# Patient Record
Sex: Female | Born: 1985 | Race: Black or African American | Hispanic: No | Smoking: Former smoker
Health system: Southern US, Community
[De-identification: ages and names within clinical notes are randomized; demographics above are authoritative.]

## PROBLEM LIST (undated history)

## (undated) DIAGNOSIS — N159 Renal tubulo-interstitial disease, unspecified: Secondary | ICD-10-CM

## (undated) DIAGNOSIS — F329 Major depressive disorder, single episode, unspecified: Secondary | ICD-10-CM

## (undated) DIAGNOSIS — F32A Depression, unspecified: Secondary | ICD-10-CM

---
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
## 2011-01-13 ENCOUNTER — Inpatient Hospital Stay (HOSPITAL_COMMUNITY)
Admission: EM | Admit: 2011-01-13 | Discharge: 2011-01-13 | Disposition: A | Discharge status: Home / Self Care | Payer: Self-pay | Source: Ambulatory Visit | Attending: Obstetrics & Gynecology | Admitting: Obstetrics & Gynecology

## 2011-01-13 DIAGNOSIS — R109 Unspecified abdominal pain: Secondary | ICD-10-CM | POA: Insufficient documentation

## 2011-01-13 LAB — URINALYSIS, ROUTINE W REFLEX MICROSCOPIC
Bilirubin Urine: NEGATIVE
Glucose, UA: NEGATIVE mg/dL
Hgb urine dipstick: NEGATIVE
Ketones, ur: NEGATIVE mg/dL
Nitrite: NEGATIVE
Protein, ur: NEGATIVE mg/dL
Specific Gravity, Urine: >1.03 — ABNORMAL HIGH (ref 1.005–1.030)
Urobilinogen, UA: 0.2 mg/dL (ref 0.0–1.0)
pH: 5.5 (ref 5.0–8.0)

## 2011-01-13 LAB — POCT PREGNANCY, URINE: Preg Test, Ur: NEGATIVE

## 2011-01-13 LAB — URINE MICROSCOPIC-ADD ON

## 2011-01-15 ENCOUNTER — Emergency Department (HOSPITAL_COMMUNITY)
Admission: EM | Admit: 2011-01-15 | Discharge: 2011-01-15 | Disposition: A | Discharge status: Home / Self Care | Payer: Self-pay | Attending: Emergency Medicine | Admitting: Emergency Medicine

## 2011-01-15 DIAGNOSIS — K089 Disorder of teeth and supporting structures, unspecified: Secondary | ICD-10-CM | POA: Insufficient documentation

## 2011-02-02 ENCOUNTER — Inpatient Hospital Stay (HOSPITAL_COMMUNITY)
Admission: AD | Admit: 2011-02-02 | Discharge: 2011-02-02 | Disposition: A | Discharge status: Home / Self Care | Payer: Self-pay | Source: Ambulatory Visit | Attending: Family Medicine | Admitting: Family Medicine

## 2011-02-02 DIAGNOSIS — N912 Amenorrhea, unspecified: Secondary | ICD-10-CM | POA: Insufficient documentation

## 2011-02-02 LAB — URINE MICROSCOPIC-ADD ON

## 2011-02-02 LAB — URINALYSIS, ROUTINE W REFLEX MICROSCOPIC
Bilirubin Urine: NEGATIVE
Glucose, UA: NEGATIVE mg/dL
Ketones, ur: NEGATIVE mg/dL
Specific Gravity, Urine: 1.025 (ref 1.005–1.030)
pH: 6 (ref 5.0–8.0)

## 2011-02-02 LAB — POCT PREGNANCY, URINE: Preg Test, Ur: NEGATIVE

## 2011-02-02 LAB — WET PREP, GENITAL: Yeast Wet Prep HPF POC: NONE SEEN

## 2011-02-03 LAB — GC/CHLAMYDIA PROBE AMP, GENITAL: Chlamydia, DNA Probe: NEGATIVE

## 2011-03-03 ENCOUNTER — Encounter: Payer: Self-pay | Admitting: Obstetrics & Gynecology

## 2011-05-09 ENCOUNTER — Emergency Department (HOSPITAL_COMMUNITY)
Admission: EM | Admit: 2011-05-09 | Discharge: 2011-05-09 | Disposition: A | Discharge status: Home / Self Care | Payer: Self-pay | Attending: Emergency Medicine | Admitting: Emergency Medicine

## 2011-05-09 DIAGNOSIS — R599 Enlarged lymph nodes, unspecified: Secondary | ICD-10-CM | POA: Insufficient documentation

## 2011-05-09 DIAGNOSIS — R221 Localized swelling, mass and lump, neck: Secondary | ICD-10-CM | POA: Insufficient documentation

## 2011-05-09 DIAGNOSIS — J029 Acute pharyngitis, unspecified: Secondary | ICD-10-CM | POA: Insufficient documentation

## 2011-05-09 DIAGNOSIS — R220 Localized swelling, mass and lump, head: Secondary | ICD-10-CM | POA: Insufficient documentation

## 2011-05-09 DIAGNOSIS — R509 Fever, unspecified: Secondary | ICD-10-CM | POA: Insufficient documentation

## 2011-05-11 ENCOUNTER — Emergency Department (HOSPITAL_COMMUNITY)
Admission: EM | Admit: 2011-05-11 | Discharge: 2011-05-11 | Disposition: A | Discharge status: Home / Self Care | Payer: Self-pay | Attending: Emergency Medicine | Admitting: Emergency Medicine

## 2011-05-11 DIAGNOSIS — R1310 Dysphagia, unspecified: Secondary | ICD-10-CM | POA: Insufficient documentation

## 2011-05-11 DIAGNOSIS — R509 Fever, unspecified: Secondary | ICD-10-CM | POA: Insufficient documentation

## 2011-05-11 DIAGNOSIS — R070 Pain in throat: Secondary | ICD-10-CM | POA: Insufficient documentation

## 2011-05-11 DIAGNOSIS — J36 Peritonsillar abscess: Secondary | ICD-10-CM | POA: Insufficient documentation

## 2011-05-11 LAB — CBC
Hemoglobin: 13.9 g/dL (ref 12.0–15.0)
MCH: 27 pg (ref 26.0–34.0)
MCV: 81.5 fL (ref 78.0–100.0)
Platelets: 264 10*3/uL (ref 150–400)
RBC: 5.14 MIL/uL — ABNORMAL HIGH (ref 3.87–5.11)
WBC: 15.6 10*3/uL — ABNORMAL HIGH (ref 4.0–10.5)

## 2011-05-11 LAB — DIFFERENTIAL
Basophils Relative: 0 % (ref 0–1)
Eosinophils Absolute: 0.1 10*3/uL (ref 0.0–0.7)
Lymphs Abs: 2.2 10*3/uL (ref 0.7–4.0)
Monocytes Relative: 9 % (ref 3–12)
Neutro Abs: 12 10*3/uL — ABNORMAL HIGH (ref 1.7–7.7)
Neutrophils Relative %: 77 % (ref 43–77)

## 2011-05-11 LAB — BASIC METABOLIC PANEL
CO2: 25 mEq/L (ref 19–32)
Chloride: 103 mEq/L (ref 96–112)
Glucose, Bld: 77 mg/dL (ref 70–99)
Sodium: 138 mEq/L (ref 135–145)

## 2011-05-30 NOTE — Consult Note (Signed)
NAMEBALERIA, Savannah [name] NO.:  [id number]  MEDICAL RECORD NO.:  [id number]  LOCATION:  [facility]                         FACILITY:  [facility]  PHYSICIAN:  Newman Pies, MD            DATE OF BIRTH:  09/17/1985  DATE OF CONSULTATION:  05/11/2011 DATE OF DISCHARGE:                                CONSULTATION   CHIEF COMPLAINT:  Left peritonsillar abscess.  HISTORY OF PRESENT ILLNESS:  The patient is a 25 year old African American female who presents to the [facility] today complaining of severe left-sided sore throat for the past 5 days.  The patient was seen in the emergency room 2 days ago and was given amoxicillin.  However, she complains of progressive worsening of her throat pain even with antibiotics.  Over the last 2 days, she was unable to swallow her saliva.  She has significant dysphagia and odynophagia. The patient also complains of dysphonia and subjective fever.  She has a history of recurrent tonsillitis.  She typically has 1-2 episodes of severe sore throat the year.  She has no previous history of tonsillectomy.  PAST MEDICAL HISTORY:  Moderate obesity, abnormal Pap smear.  PAST SURGICAL HISTORY:  None.  HOME MEDICATIONS:  Amoxicillin.  ALLERGIES:  SUDAFED, which caused localized swelling.  SOCIAL HISTORY:  The patient has a history of cannabis abuse.  She is a current smoker and occasional drinker.  REVIEW OF SYSTEM:  Negative, except as noted above.  PHYSICAL EXAMINATION:  VITALS:  Temperature 99.0, blood pressure 132/80, pulse 108, respirations 16 and oxygen saturation 99% on room air. GENERAL:  The patient is a mildly obese female, in no acute distress. She is alert and oriented x3. HEENT:  Her pupils are equal, round and reactive to light.  Extraocular motion is intact.  Examination of the ears shows normal auricles and external auditory canals.  Nasal examination shows normal septum, turbinates, and nasal mucosa.  Oral cavity  examination shows some mild trismus.  The maximal oral opening is approximately 3-cm.  The lip, gum, tongue, and oral cavity mucosa are within normal limits.  Bilateral tonsillar edema and erythema noted.  The left peritonsillar area is significantly more edematous than the right side, resulting in uvula deviation to the right side.  Palpation of the neck reveals no significant lymphadenopathy or mass.  The trachea is midline.  No stridor is noted.  PROCEDURE PERFORMED:  Incision and drainage of the left peritonsillar abscess.  ANESTHESIA:  Local anesthesia with 1% lidocaine with 1:100,000 epinephrine.  DESCRIPTION:  The patient is placed upright in her hospital bed.  A 1% lidocaine with 1:100,000 epinephrine is locally infiltrated around the left peritonsillar area.  After adequate local anesthesia is achieved, a 20-gauge spinal needle is used to make multiple passes through the peritonsillar area.  Approximately, 0.5 mL of purulent fluid is suctioned.  The superior peritonsillar area is then incised opened with the tip of the spinal needle.  The patient tolerated the procedure well. IMPRESSION:  Bilateral tonsillitis with mild left peritonsillar abscess.  PLAN: 1. IV clindamycin and Decadron in the emergency room as prescribed. 2. Incision and drainage of the left  peritonsillar abscess under local     anesthesia. 3. The patient will be discharged home on clindamycin 300 mg p.o.     q.i.d. for 10 days, and prednisone Dosepak for 6 days.  The patient     will follow up in my office in 1 week if she continues to be     symptomatic.     Newman Pies, MD     ST/MEDQ  D:  05/11/2011  T:  05/11/2011  Job:  [id number]  Electronically Signed by Newman Pies MD on 05/30/2011 05:44:30 PM

## 2011-11-09 ENCOUNTER — Encounter (HOSPITAL_COMMUNITY): Payer: Self-pay | Admitting: *Deleted

## 2011-11-09 ENCOUNTER — Emergency Department (INDEPENDENT_AMBULATORY_CARE_PROVIDER_SITE_OTHER)
Admission: EM | Admit: 2011-11-09 | Discharge: 2011-11-09 | Disposition: A | Discharge status: Home / Self Care | Payer: Self-pay | Source: Home / Self Care | Attending: Emergency Medicine | Admitting: Emergency Medicine

## 2011-11-09 DIAGNOSIS — J020 Streptococcal pharyngitis: Secondary | ICD-10-CM

## 2011-11-09 LAB — POCT INFECTIOUS MONO SCREEN: Mono Screen: NEGATIVE

## 2011-11-09 LAB — POCT RAPID STREP A: Streptococcus, Group A Screen (Direct): NEGATIVE

## 2011-11-09 MED ORDER — ONDANSETRON 8 MG PO TBDP: 8.0000 mg | ORAL_TABLET | Freq: Three times a day (TID) | ORAL | Status: AC | PRN

## 2011-11-09 MED ORDER — PENICILLIN G BENZATHINE 1200000 UNIT/2ML IM SUSP
1.2000 10*6.[IU] | Freq: Once | INTRAMUSCULAR | Status: AC
  Administered 2011-11-09: 1.2 10*6.[IU] via INTRAMUSCULAR

## 2011-11-09 MED ORDER — PENICILLIN G BENZATHINE 1200000 UNIT/2ML IM SUSP
INTRAMUSCULAR | Status: AC
  Filled 2011-11-09: qty 2

## 2011-11-09 NOTE — ED Provider Notes (Signed)
Chief Complaint  Patient presents with  . Sore Throat    History of Present Illness:   Savannah [name] has had a two-day history of severe sore throat, pain with swallowing, nausea, vomiting, fever, chills, nasal congestion, rhinorrhea, swollen, tender glands in her neck, abdominal pain, and loose stools. She denies any cough, wheezing, or shortness of breath. The patient states that she has had tonsillitis or strep throat at least 3 or 4 times every year for as long as she can remember. She has always gone to the emergency room for this. She states she's never been told that she needs to have her tonsils removed.  Review of Systems:  Other than noted above, the patient denies any of the following symptoms. Systemic:  No fever, chills, sweats, fatigue, myalgias, headache, or anorexia. Eye:  No redness, pain or drainage. ENT:  No earache, nasal congestion, rhinorrhea, sinus pressure, or sore throat. Lungs:  No cough, sputum production, wheezing, shortness of breath. Or chest pain. GI:  No nausea, vomiting, abdominal pain or diarrhea. Skin:  No rash or itching.  PMFSH:  Past medical history, family history, social history, meds, and allergies were reviewed.  Physical Exam:   Vital signs:  BP 152/63  Pulse 111  Temp(Src) 101.3 F (38.5 C) (Oral)  Resp 32  SpO2 100%  LMP 11/08/2011 General:  Alert, and appears uncomfortable due to severe sore throat. Eye:  No conjunctival injection or drainage. ENT:  TMs and canals were normal, without erythema or inflammation.  Nasal mucosa was clear and uncongested, without drainage.  Mucous membranes were moist.  Tonsils were enlarged and red and covered with whitish exudate. There was no evidence of a peritonsillar abscess.  There were no oral ulcerations or lesions. Neck:  Supple, no adenopathy, tenderness or mass. Lungs:  No respiratory distress.  Lungs were clear to auscultation, without wheezes, rales or rhonchi.  Breath sounds were clear and equal  bilaterally. Heart:  Regular rhythm, without gallops, murmers or rubs. Skin:  Clear, warm, and dry, without rash or lesions.  Labs:   Results for orders placed during the hospital encounter of 11/09/11  POCT RAPID STREP A ([facility] ONLY)      Component Value Range   Streptococcus, Group A Screen (Direct) NEGATIVE  NEGATIVE   POCT INFECTIOUS MONO SCREEN      Component Value Range   Mono Screen NEGATIVE  NEGATIVE     Course in Urgent Care Center:   She was given Bicillin LA 1.2 million units IM and tolerated this well without any immediate reaction or side effects.  Medical Decision Making:  I think at this point the patient needs to have a tonsillectomy since she seems to be getting strep throat or tonsillitis 3 or 4 times every year.  Assessment:  The encounter diagnosis was Strep throat.  Plan:   1.  The following meds were prescribed:   New Prescriptions   ONDANSETRON (ZOFRAN ODT) 8 MG DISINTEGRATING TABLET    Take 1 tablet (8 mg total) by mouth every 8 (eight) hours as needed for nausea.   2.  The patient was instructed in symptomatic care and handouts were given. 3.  The patient was told to return if becoming worse in any way, if no better in 3 or 4 days, and given some red flag symptoms that would indicate earlier return.  Follow up:  The patient was told to follow up with Dr. Emmit Pomfret for consideration for tonsillectomy.   Reuben Likes,  MD 11/09/11 1805

## 2011-11-09 NOTE — Discharge Instructions (Signed)

## 2011-11-09 NOTE — ED Notes (Signed)
Says tonsils swollen - onset last PM. Feels like she is getting a runny nose - has tonsil problem 3-4 times a year.  Also C/O nausea and vomiting - onset this AM x 7  Says 3 times since she got to [employer]. One loose stool this AM. No cough.

## 2011-11-10 ENCOUNTER — Encounter (HOSPITAL_COMMUNITY): Payer: Self-pay | Admitting: Emergency Medicine

## 2011-11-10 ENCOUNTER — Emergency Department (HOSPITAL_COMMUNITY)
Admission: EM | Admit: 2011-11-10 | Discharge: 2011-11-10 | Disposition: A | Discharge status: Home / Self Care | Payer: Self-pay | Attending: Emergency Medicine | Admitting: Emergency Medicine

## 2011-11-10 DIAGNOSIS — J029 Acute pharyngitis, unspecified: Secondary | ICD-10-CM

## 2011-11-10 DIAGNOSIS — R070 Pain in throat: Secondary | ICD-10-CM | POA: Insufficient documentation

## 2011-11-10 DIAGNOSIS — F17200 Nicotine dependence, unspecified, uncomplicated: Secondary | ICD-10-CM | POA: Insufficient documentation

## 2011-11-10 MED ORDER — ACETAMINOPHEN 325 MG PO TABS
650.0000 mg | ORAL_TABLET | Freq: Once | ORAL | Status: AC
  Administered 2011-11-10: 650 mg via ORAL
  Filled 2011-11-10: qty 2

## 2011-11-10 MED ORDER — DEXAMETHASONE 6 MG PO TABS
6.0000 mg | ORAL_TABLET | ORAL | Status: AC
  Administered 2011-11-10: 6 mg via ORAL
  Filled 2011-11-10: qty 1

## 2011-11-10 MED ORDER — IBUPROFEN 600 MG PO TABS: 600.0000 mg | ORAL_TABLET | Freq: Four times a day (QID) | ORAL | Status: AC | PRN

## 2011-11-10 NOTE — Discharge Instructions (Signed)
Pharyngitis, Viral and Bacterial Pharyngitis is soreness (inflammation) or infection of the pharynx. It is also called a sore throat. CAUSES  Most sore throats are caused by viruses and are part of a cold. However, some sore throats are caused by strep and other bacteria. Sore throats can also be caused by post nasal drip from draining sinuses, allergies and sometimes from sleeping with an open mouth. Infectious sore throats can be spread from person to person by coughing, sneezing and sharing cups or eating utensils. TREATMENT  Sore throats that are viral usually last 3-4 days. Viral illness will get better without medications (antibiotics). Strep throat and other bacterial infections will usually begin to get better about 24-48 hours after you begin to take antibiotics. HOME CARE INSTRUCTIONS   If the caregiver feels there is a bacterial infection or if there is a positive strep test, they will prescribe an antibiotic. The full course of antibiotics must be taken. If the full course of antibiotic is not taken, you or your child may become ill again. If you or your child has strep throat and do not finish all of the medication, serious heart or kidney diseases may develop.   Drink enough water and fluids to keep your urine clear or pale yellow.   Only take over-the-counter or prescription medicines for pain, discomfort or fever as directed by your caregiver.   Get lots of rest.   Gargle with salt water ( tsp. of salt in a glass of water) as often as every 1-2 hours as you need for comfort.   Hard candies may soothe the throat if individual is not at risk for choking. Throat sprays or lozenges may also be used.  SEEK MEDICAL CARE IF:   Large, tender lumps in the neck develop.   A rash develops.   Green, yellow-brown or bloody sputum is coughed up.   Your baby is older than 3 months with a rectal temperature of 100.5 F (38.1 C) or higher for more than 1 day.  SEEK IMMEDIATE MEDICAL CARE  IF:   A stiff neck develops.   You or your child are drooling or unable to swallow liquids.   You or your child are vomiting, unable to keep medications or liquids down.   You or your child has severe pain, unrelieved with recommended medications.   You or your child are having difficulty breathing (not due to stuffy nose).   You or your child are unable to fully open your mouth.   You or your child develop redness, swelling, or severe pain anywhere on the neck.   You have a fever.   Your baby is older than 3 months with a rectal temperature of 102 F (38.9 C) or higher.   Your baby is 3 months old or younger with a rectal temperature of 100.4 F (38 C) or higher.  MAKE SURE YOU:   Understand these instructions.   Will watch your condition.   Will get help right away if you are not doing well or get worse.  Document Released: 07/25/2005 Document Revised: 07/14/2011 Document Reviewed: 10/22/2007 [employer] Patient Information 2012 [employer].Salt Water Gargle This solution will help make your mouth and throat feel better. HOME CARE INSTRUCTIONS   Mix 1 teaspoon of salt in 8 ounces of warm water.   Gargle with this solution as much or often as you need or as directed. Swish and gargle gently if you have any sores or wounds in your mouth.   Do not   swallow this mixture.  Document Released: 04/28/2004 Document Revised: 07/14/2011 Document Reviewed: 09/19/2008 [facility] Patient Information 2012 [place], [place].Sore Throat A sore throat is felt inside the throat and at the back of the mouth. It hurts to swallow or the throat may feel dry and scratchy. It can be caused by germs, smoking, pollution, or allergies.  HOME CARE   Only take medicine as told by your doctor.   Drink enough fluids to keep your pee (urine) clear or pale yellow.   Eat soft foods.   Do not smoke.   Rinse the mouth (gargle) with warm water or salt water ( teaspoon salt in 8 ounces of water).    Try throat sprays, lozenges, or suck on hard candy.  GET HELP RIGHT AWAY IF:   You have trouble breathing.   Your sore throat lasts longer than 1 week.   There is more puffiness (swelling) in the throat.   The pain is so bad that you are unable to swallow.   You have a very bad headache or a red rash.   You start to throw up (vomit).   You or your child has a temperature by mouth above 102 F (38.9 C), not controlled by medicine.   Your baby is older than 3 months with a rectal temperature of 102 F (38.9 C) or higher.   Your baby is 11 months old or younger with a rectal temperature of 100.4 F (38 C) or higher.  MAKE SURE YOU:   Understand these instructions.   Will watch your condition.   Will get help right away if you are not doing well or get worse.  Document Released: 05/03/2008 Document Revised: 07/14/2011 Document Reviewed: 05/03/2008 [facility] Patient Information 2012 [place], [place]. He had been given  Decadron to decrease swelling.  This is a one-time dose.  It is necessary please take regular doses of either Tylenol or ibuprofen for pain and worked fever.  Drink plenty of fluids.  She also again been given a referral to Dr. Valeta Harms,  for followup for valuation of potential tonsillectomy

## 2011-11-10 NOTE — ED Notes (Signed)
The pt was seen at [facility] yesterday and had a neg strep screen

## 2011-11-10 NOTE — ED Notes (Signed)
sorethroat with a temp for 4 days

## 2011-11-10 NOTE — ED Notes (Signed)
11:20 am.  Mother had called reporting prescription was not called in to pharmacy at 4/3 visit.  Spoke with pharmacy: pharmacy needing to talk to patient about expense of medicine.  Called patient's mother at number left for notification Shirlee Limerick huntley [phone]).  No answer.  Left message to contact pharmacy and or ucc for explanation about medicines.

## 2011-11-10 NOTE — ED Notes (Signed)
Pt drinking fluids.

## 2011-11-10 NOTE — ED Notes (Signed)
PT. REPORTS PERSISTENT SORE THROAT FOR 4 DAYS SEEN AT [facility] FOR THE SAME COMPLAINTS PRESCRIPTION GIVEN AT DISCHARGE.

## 2011-11-10 NOTE — ED Notes (Signed)
The pt is drinking without difficulty

## 2011-11-10 NOTE — ED Provider Notes (Signed)
History     CSN: [id number]  Arrival date & time 11/10/11  1840   First MD Initiated Contact with Patient 11/10/11 2012      Chief Complaint  Patient presents with  . Sore Throat    (Consider location/radiation/quality/duration/timing/severity/associated sxs/prior treatment) HPI Comments: She was seen and treated yesterday in urgent care for presumptive strep although the records.  Culture was negative.  She gives a history of having several episodes of agitated tonsillitis yearly since age of 13.  She was also given a prescription for Zofran to control her nausea.  Presents to the emergency room today for pain control, although she has not taken any over-the-counter medications, stating that these don't work for her  She didn't know what to take and she didn't have anything in her home.  She was referred to Dr. Lazarus Salines for further evaluation and potential tonsillectomy.  She has not made an appointment as an exact  Patient is a 26 y.o. female presenting with pharyngitis. The history is provided by the patient.  Sore Throat This is a recurrent problem. Associated symptoms include a fever and a sore throat. Pertinent negatives include no chills, headaches or weakness.    History reviewed. No pertinent past medical history.  History reviewed. No pertinent past surgical history.  No family history on file.  History  Substance Use Topics  . Smoking status: Current Everyday Smoker -- 0.3 packs/day  . Smokeless tobacco: Not on file  . Alcohol Use: Yes     Occasional Drinker    OB History    Grav Para Term Preterm Abortions TAB SAB Ect Mult Living                  Review of Systems  Constitutional: Positive for fever. Negative for chills.  HENT: Positive for sore throat and voice change. Negative for trouble swallowing.   Genitourinary: Negative for dysuria.  Neurological: Negative for dizziness, weakness and headaches.    Allergies  Sudafed  Home Medications   Current  Outpatient Rx  Name Route Sig Dispense Refill  . ONDANSETRON 8 MG PO TBDP Oral Take 1 tablet (8 mg total) by mouth every 8 (eight) hours as needed for nausea. 20 tablet 0    BP 130/68  Pulse 140  Temp(Src) 102.6 F (39.2 C) (Oral)  Resp 18  SpO2 100%  LMP 11/08/2011  Physical Exam  Constitutional: She is oriented to person, place, and time. She appears well-developed and well-nourished.  HENT:  Mouth/Throat: Uvula is midline and mucous membranes are normal. Oropharyngeal exudate present. No posterior oropharyngeal edema, posterior oropharyngeal erythema or tonsillar abscesses.  Neck: Normal range of motion. Neck supple. No thyromegaly present.  Cardiovascular: Regular rhythm.  Tachycardia present.   Pulmonary/Chest: Effort normal.  Abdominal: Soft.  Musculoskeletal: Normal range of motion.  Neurological: She is alert and oriented to person, place, and time.  Skin: Skin is warm.    ED Course  Procedures (including critical care time)  Labs Reviewed - No data to display No results found.   No diagnosis found. 9:36 PM feeling better, tolerating by mouth fluids again, will discharge with recommendations for alternating doses of Tylenol, and or  Motrin for pain control And again, refer to Dr. Levora Angel for followup  MDM  Mr. Decadron for swelling, and Tylenol for pain control and reassess        Arman Filter, NP 11/10/11 2141

## 2011-11-11 NOTE — ED Provider Notes (Signed)
Medical screening examination/treatment/procedure(s) were performed by non-physician practitioner and as supervising physician I was immediately available for consultation/collaboration.   Nithya Meriweather, MD 11/11/11 0036 

## 2011-12-04 ENCOUNTER — Emergency Department (HOSPITAL_COMMUNITY)
Admission: EM | Admit: 2011-12-04 | Discharge: 2011-12-04 | Disposition: A | Discharge status: Home / Self Care | Payer: Self-pay | Attending: Emergency Medicine | Admitting: Emergency Medicine

## 2011-12-04 ENCOUNTER — Encounter (HOSPITAL_COMMUNITY): Payer: Self-pay | Admitting: Emergency Medicine

## 2011-12-04 DIAGNOSIS — F17200 Nicotine dependence, unspecified, uncomplicated: Secondary | ICD-10-CM | POA: Insufficient documentation

## 2011-12-04 DIAGNOSIS — Z113 Encounter for screening for infections with a predominantly sexual mode of transmission: Secondary | ICD-10-CM | POA: Insufficient documentation

## 2011-12-04 DIAGNOSIS — N390 Urinary tract infection, site not specified: Secondary | ICD-10-CM | POA: Insufficient documentation

## 2011-12-04 LAB — URINE MICROSCOPIC-ADD ON

## 2011-12-04 LAB — URINALYSIS, ROUTINE W REFLEX MICROSCOPIC
Nitrite: POSITIVE — AB
Specific Gravity, Urine: 1.022 (ref 1.005–1.030)
Urobilinogen, UA: 1 mg/dL (ref 0.0–1.0)
pH: 6 (ref 5.0–8.0)

## 2011-12-04 LAB — POCT PREGNANCY, URINE: Preg Test, Ur: NEGATIVE

## 2011-12-04 MED ORDER — CEPHALEXIN 500 MG PO CAPS: 500.0000 mg | ORAL_CAPSULE | Freq: Four times a day (QID) | ORAL | Status: AC

## 2011-12-04 NOTE — ED Notes (Addendum)
Patient states she has just found out boyfriend has been diagnosed with herpes and would like to get checked out.  No symptoms at this time.  She would like to get tested for STD's.
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
## 2011-12-04 NOTE — ED Notes (Signed)
Pelvic cart set up in room 
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
## 2011-12-04 NOTE — ED Notes (Signed)
EDP into room 

## 2011-12-04 NOTE — ED Provider Notes (Signed)
  I performed a history and physical examination of Savannah [name] and discussed her management with Dr. Vear Clock.  I agree with the history, physical, assessment, and plan of care, with the following exceptions: None  Exposure to herpes and wants check for STDs. Asymptomatic.  Labs, reassure and dc home with referral to health dept  Dayton Bailiff    Dayton Bailiff, MD 12/04/11 2308

## 2011-12-04 NOTE — ED Notes (Signed)
Discharge instructions reviewed with pt; verbalizes understanding.  No questions asked; no further c/o's voiced.  Pt ambulatory to lobby.

## 2011-12-04 NOTE — Discharge Instructions (Signed)
 RESOURCE GUIDE  Dental Problems  Patients with Medicaid: [facility]                     [facility] [address]                                           [address] Phone:  [phone]                                                  Phone:  [phone]  If unable to pay or uninsured, contact:  [facility] or [facility] to become qualified for the adult dental clinic.  Chronic Pain Problems Contact [facility]  [phone] Patients need to be referred by their primary care doctor.  Insufficient Money for Medicine Contact [employer]:  call "211" or [facility] 271-5999.  No Primary Care Doctor Call [facility]  832-8000 Other agencies that provide inexpensive medical care    [facility]  [phone]    [facility]  [phone]    [facility]  [phone]    [facility]  [phone]    [facility]  [phone]    [facility]  [phone]  Psychological Services [facility]  [phone] [facility]  [phone] [facility]   [phone] (emergency services [phone])  Substance Abuse Resources [facility]  336-882-2125 [facility] 336-784-9470 [facility] 336-285-9073 [facility] 336-845-3988 Residential & Outpatient Substance Abuse Program  800-659-3381  Abuse/Neglect [place] Child Abuse Hotline (336) 641-3795 [place] [facility] Hotline 800-378-5315 (After Hours)  Emergency Shelter [facility] (336) 271-5985  [facility] (336) 275-9566 [facility] (704) 372-4663  MRSA Hotline #:   832-7006    [place] [facility]  [facility] [place]     [employer]                          [facility] [address] [place]                       [address]      [address]  [place]                                                [place]                            [place] Phone:  [phone]                                   Phone:  [phone]                 Phone:  [phone]  [facility] Phone:    [phone]  [place] Child Abuse Hotline (336) 342-1394 (336) 342-3537 (After Hours)   

## 2011-12-05 LAB — GC/CHLAMYDIA PROBE AMP, GENITAL
Chlamydia, DNA Probe: NEGATIVE
GC Probe Amp, Genital: NEGATIVE

## 2012-04-04 ENCOUNTER — Emergency Department (INDEPENDENT_AMBULATORY_CARE_PROVIDER_SITE_OTHER)
Admission: EM | Admit: 2012-04-04 | Discharge: 2012-04-04 | Disposition: A | Discharge status: Home / Self Care | Payer: Self-pay | Source: Home / Self Care | Attending: Family Medicine | Admitting: Family Medicine

## 2012-04-04 ENCOUNTER — Encounter (HOSPITAL_COMMUNITY): Payer: Self-pay | Admitting: *Deleted

## 2012-04-04 DIAGNOSIS — N390 Urinary tract infection, site not specified: Secondary | ICD-10-CM

## 2012-04-04 DIAGNOSIS — J309 Allergic rhinitis, unspecified: Secondary | ICD-10-CM

## 2012-04-04 DIAGNOSIS — J302 Other seasonal allergic rhinitis: Secondary | ICD-10-CM

## 2012-04-04 LAB — POCT URINALYSIS DIP (DEVICE)
Bilirubin Urine: NEGATIVE
Glucose, UA: NEGATIVE mg/dL
Leukocytes, UA: NEGATIVE
Nitrite: POSITIVE — AB
Urobilinogen, UA: 0.2 mg/dL (ref 0.0–1.0)

## 2012-04-04 LAB — POCT PREGNANCY, URINE: Preg Test, Ur: NEGATIVE

## 2012-04-04 MED ORDER — FLUTICASONE PROPIONATE 50 MCG/ACT NA SUSP: 1.0000 | Freq: Two times a day (BID) | NASAL | Status: DC

## 2012-04-04 MED ORDER — FEXOFENADINE HCL 180 MG PO TABS: 180.0000 mg | ORAL_TABLET | Freq: Every day | ORAL | Status: DC

## 2012-04-04 MED ORDER — CEPHALEXIN 500 MG PO CAPS: 500.0000 mg | ORAL_CAPSULE | Freq: Four times a day (QID) | ORAL | Status: AC

## 2012-04-04 NOTE — ED Provider Notes (Signed)
History     CSN: [id number]  Arrival date & time 04/04/12  1516   First MD Initiated Contact with Patient 04/04/12 1524      Chief Complaint  Patient presents with  . URI  . Urinary Tract Infection    (Consider location/radiation/quality/duration/timing/severity/associated sxs/prior treatment) Patient is a 26 y.o. female presenting with URI and urinary tract infection. The history is provided by the patient.  URI The primary symptoms include sore throat. Primary symptoms do not include fever, swollen glands, wheezing, nausea or vomiting. The current episode started 2 days ago. This is a new problem.  Symptoms associated with the illness include congestion and rhinorrhea.  Urinary Tract Infection    History reviewed. No pertinent past medical history.  History reviewed. No pertinent past surgical history.  Family History  Problem Relation Age of Onset  . Family history unknown: Yes    History  Substance Use Topics  . Smoking status: Current Everyday Smoker -- 0.3 packs/day    Types: Cigarettes  . Smokeless tobacco: Not on file  . Alcohol Use: Yes     Occasional Drinker    OB History    Grav Para Term Preterm Abortions TAB SAB Ect Mult Living                  Review of Systems  Constitutional: Negative.  Negative for fever.  HENT: Positive for congestion, sore throat and rhinorrhea.   Respiratory: Negative.  Negative for wheezing.   Gastrointestinal: Negative for nausea and vomiting.    Allergies  Sudafed  Home Medications   Current Outpatient Rx  Name Route Sig Dispense Refill  . CEPHALEXIN 500 MG PO CAPS Oral Take 1 capsule (500 mg total) by mouth 4 (four) times daily. Take all of medicine and drink lots of fluids 20 capsule 0  . FEXOFENADINE HCL 180 MG PO TABS Oral Take 1 tablet (180 mg total) by mouth daily. 30 tablet 1  . FLUTICASONE PROPIONATE 50 MCG/ACT NA SUSP Nasal Place 1 spray into the nose 2 (two) times daily. 1 g 2    BP 136/78  Pulse 87   Temp 97.2 F (36.2 C) (Oral)  Resp 22  SpO2 97%  LMP 03/27/2012  Physical Exam  Nursing note and vitals reviewed. Constitutional: She is oriented to person, place, and time. She appears well-developed and well-nourished.  HENT:  Head: Normocephalic.  Right Ear: External ear normal.  Left Ear: External ear normal.  Nose: Nose normal.  Mouth/Throat: Oropharynx is clear and moist. No oropharyngeal exudate.  Eyes: Conjunctivae are normal. Pupils are equal, round, and reactive to light.  Neck: Normal range of motion. Neck supple.  Cardiovascular: Regular rhythm.   Pulmonary/Chest: Effort normal and breath sounds normal.  Lymphadenopathy:    She has no cervical adenopathy.  Neurological: She is alert and oriented to person, place, and time.  Skin: Skin is warm and dry.    ED Course  Procedures (including critical care time)  Labs Reviewed  POCT URINALYSIS DIP (DEVICE) - Abnormal; Notable for the following:    Hgb urine dipstick TRACE (*)     Nitrite POSITIVE (*)     All other components within normal limits  POCT PREGNANCY, URINE   No results found.   1. Seasonal allergies   2. UTI (lower urinary tract infection)       MDM  U/a neg.       Linna Hoff, MD 04/04/12 [phone]

## 2012-04-04 NOTE — ED Notes (Signed)
Pt reports cold symptoms - throat hurting, coughing, sneezing for the past two days. Also states that she has odor with urination and lower back pain.

## 2012-05-03 ENCOUNTER — Emergency Department (HOSPITAL_COMMUNITY)
Admission: EM | Admit: 2012-05-03 | Discharge: 2012-05-04 | Disposition: A | Discharge status: Home / Self Care | Payer: Self-pay | Attending: Emergency Medicine | Admitting: Emergency Medicine

## 2012-05-03 ENCOUNTER — Encounter (HOSPITAL_COMMUNITY): Payer: Self-pay | Admitting: Emergency Medicine

## 2012-05-03 DIAGNOSIS — IMO0002 Reserved for concepts with insufficient information to code with codable children: Secondary | ICD-10-CM | POA: Insufficient documentation

## 2012-05-03 DIAGNOSIS — F17200 Nicotine dependence, unspecified, uncomplicated: Secondary | ICD-10-CM | POA: Insufficient documentation

## 2012-05-03 DIAGNOSIS — X58XXXA Exposure to other specified factors, initial encounter: Secondary | ICD-10-CM | POA: Insufficient documentation

## 2012-05-03 LAB — URINALYSIS, MICROSCOPIC ONLY
Bilirubin Urine: NEGATIVE
Hgb urine dipstick: NEGATIVE
Ketones, ur: 15 mg/dL — AB
Specific Gravity, Urine: 1.027 (ref 1.005–1.030)
pH: 5.5 (ref 5.0–8.0)

## 2012-05-03 LAB — COMPREHENSIVE METABOLIC PANEL
ALT: 10 U/L (ref 0–35)
AST: 19 U/L (ref 0–37)
Alkaline Phosphatase: 85 U/L (ref 39–117)
CO2: 26 mEq/L (ref 19–32)
Calcium: 9.8 mg/dL (ref 8.4–10.5)
GFR calc non Af Amer: >90 mL/min (ref >90)
Glucose, Bld: 79 mg/dL (ref 70–99)
Potassium: 3.4 mEq/L — ABNORMAL LOW (ref 3.5–5.1)
Sodium: 137 mEq/L (ref 135–145)

## 2012-05-03 LAB — POCT PREGNANCY, URINE: Preg Test, Ur: NEGATIVE

## 2012-05-03 LAB — CBC WITH DIFFERENTIAL/PLATELET
Basophils Absolute: 0 10*3/uL (ref 0.0–0.1)
Eosinophils Relative: 2 % (ref 0–5)
Lymphocytes Relative: 42 % (ref 12–46)
Lymphs Abs: 2.5 10*3/uL (ref 0.7–4.0)
MCV: 81.9 fL (ref 78.0–100.0)
Neutro Abs: 3 10*3/uL (ref 1.7–7.7)
Neutrophils Relative %: 50 % (ref 43–77)
Platelets: 272 10*3/uL (ref 150–400)
RBC: 5.25 MIL/uL — ABNORMAL HIGH (ref 3.87–5.11)
RDW: 15.7 % — ABNORMAL HIGH (ref 11.5–15.5)
WBC: 6 10*3/uL (ref 4.0–10.5)

## 2012-05-03 MED ORDER — KETOROLAC TROMETHAMINE 60 MG/2ML IM SOLN
60.0000 mg | Freq: Once | INTRAMUSCULAR | Status: AC
  Administered 2012-05-03: 60 mg via INTRAMUSCULAR
  Filled 2012-05-03: qty 2

## 2012-05-03 MED ORDER — SULFAMETHOXAZOLE-TRIMETHOPRIM 800-160 MG PO TABS: 1.0000 | ORAL_TABLET | Freq: Two times a day (BID) | ORAL | Status: AC

## 2012-05-03 NOTE — ED Provider Notes (Signed)
History     CSN: [id number]  Arrival date & time 05/03/12  1504   First MD Initiated Contact with Patient 05/03/12 1843      Chief Complaint  Patient presents with  . Abdominal Pain    (Consider location/radiation/quality/duration/timing/severity/associated sxs/prior treatment) Patient is a 26 y.o. female presenting with abdominal pain.  Abdominal Pain The primary symptoms of the illness include abdominal pain (L flank pain) and dysuria. The primary symptoms of the illness do not include fever, fatigue, shortness of breath, nausea, vomiting, diarrhea, hematemesis, hematochezia, vaginal discharge or vaginal bleeding. The current episode started more than 2 days ago. The onset of the illness was gradual. The problem has been gradually worsening.  The dysuria is associated with frequency. The dysuria is not associated with hematuria or urgency.  Associated with: none. The patient states that she believes she is currently not pregnant. Additional symptoms associated with the illness include frequency and back pain. Symptoms associated with the illness do not include chills, anorexia, diaphoresis, heartburn, constipation, urgency or hematuria. Significant associated medical issues do not include PUD, GERD, inflammatory bowel disease, diabetes, sickle cell disease, gallstones, liver disease, substance abuse, diverticulitis, HIV or cardiac disease.    History reviewed. No pertinent past medical history.  History reviewed. No pertinent past surgical history.  History reviewed. No pertinent family history.  History  Substance Use Topics  . Smoking status: Current Every Day Smoker -- 0.3 packs/day    Types: Cigarettes  . Smokeless tobacco: Not on file  . Alcohol Use: Yes     Occasional Drinker    OB History    Grav Para Term Preterm Abortions TAB SAB Ect Mult Living                  Review of Systems  Constitutional: Negative for fever, chills, diaphoresis, activity change, appetite  change and fatigue.  HENT: Negative for ear pain, congestion, rhinorrhea and neck pain.   Eyes: Negative for pain.  Respiratory: Negative for cough and shortness of breath.   Cardiovascular: Negative for chest pain and palpitations.  Gastrointestinal: Positive for abdominal pain (L flank pain). Negative for heartburn, nausea, vomiting, diarrhea, constipation, hematochezia, anorexia and hematemesis.  Genitourinary: Positive for dysuria and frequency. Negative for urgency, hematuria, vaginal bleeding, vaginal discharge, difficulty urinating and pelvic pain.  Musculoskeletal: Positive for back pain.  Skin: Negative for rash and wound.  Neurological: Negative for weakness and headaches.  Psychiatric/Behavioral: Negative for behavioral problems, confusion and agitation.    Allergies  Sudafed  Home Medications  No current outpatient prescriptions on file.  BP 118/72  Pulse 78  Temp 97.8 F (36.6 C) (Oral)  Resp 15  SpO2 100%  LMP 03/19/2012  Physical Exam  Constitutional: She is oriented to person, place, and time. She appears well-developed and well-nourished. No distress.  HENT:  Head: Normocephalic and atraumatic.  Nose: Nose normal.  Mouth/Throat: Oropharynx is clear and moist.  Eyes: EOM are normal. Pupils are equal, round, and reactive to light.  Neck: Normal range of motion. Neck supple. No tracheal deviation present.  Cardiovascular: Normal rate, regular rhythm, normal heart sounds and intact distal pulses.   Pulmonary/Chest: Effort normal and breath sounds normal. She has no rales.  Abdominal: Soft. Bowel sounds are normal. She exhibits no distension. There is no tenderness. There is no rebound and no guarding.  Musculoskeletal: Normal range of motion. She exhibits no tenderness.  Neurological: She is alert and oriented to person, place, and time.  Skin: Skin is  warm and dry. No rash noted.  Psychiatric: She has a normal mood and affect. Her behavior is normal.    ED  Course  Procedures (including critical care time)    Results for orders placed during the hospital encounter of 05/03/12  CBC WITH DIFFERENTIAL      Component Value Range   WBC 6.0  4.0 - 10.5 K/uL   RBC 5.25 (*) 3.87 - 5.11 MIL/uL   Hemoglobin 14.2  12.0 - 15.0 g/dL   HCT 16.1  09.6 - 04.5 %   MCV 81.9  78.0 - 100.0 fL   MCH 27.0  26.0 - 34.0 pg   MCHC 33.0  30.0 - 36.0 g/dL   RDW 40.9 (*) 81.1 - 91.4 %   Platelets 272  150 - 400 K/uL   Neutrophils Relative 50  43 - 77 %   Neutro Abs 3.0  1.7 - 7.7 K/uL   Lymphocytes Relative 42  12 - 46 %   Lymphs Abs 2.5  0.7 - 4.0 K/uL   Monocytes Relative 6  3 - 12 %   Monocytes Absolute 0.4  0.1 - 1.0 K/uL   Eosinophils Relative 2  0 - 5 %   Eosinophils Absolute 0.1  0.0 - 0.7 K/uL   Basophils Relative 0  0 - 1 %   Basophils Absolute 0.0  0.0 - 0.1 K/uL  COMPREHENSIVE METABOLIC PANEL      Component Value Range   Sodium 137  135 - 145 mEq/L   Potassium 3.4 (*) 3.5 - 5.1 mEq/L   Chloride 101  96 - 112 mEq/L   CO2 26  19 - 32 mEq/L   Glucose, Bld 79  70 - 99 mg/dL   BUN 8  6 - 23 mg/dL   Creatinine, Ser 7.82  0.50 - 1.10 mg/dL   Calcium 9.8  8.4 - 95.6 mg/dL   Total Protein 8.2  6.0 - 8.3 g/dL   Albumin 4.4  3.5 - 5.2 g/dL   AST 19  0 - 37 U/L   ALT 10  0 - 35 U/L   Alkaline Phosphatase 85  39 - 117 U/L   Total Bilirubin 0.6  0.3 - 1.2 mg/dL   GFR calc non Af Amer >90  >90 mL/min   GFR calc Af Amer >90  >90 mL/min  URINALYSIS, MICROSCOPIC ONLY      Component Value Range   Color, Urine YELLOW  YELLOW   APPearance CLOUDY (*) CLEAR   Specific Gravity, Urine 1.027  1.005 - 1.030   pH 5.5  5.0 - 8.0   Glucose, UA NEGATIVE  NEGATIVE mg/dL   Hgb urine dipstick NEGATIVE  NEGATIVE   Bilirubin Urine NEGATIVE  NEGATIVE   Ketones, ur 15 (*) NEGATIVE mg/dL   Protein, ur NEGATIVE  NEGATIVE mg/dL   Urobilinogen, UA 1.0  0.0 - 1.0 mg/dL   Nitrite NEGATIVE  NEGATIVE   Leukocytes, UA TRACE (*) NEGATIVE   WBC, UA 0-2  <3 WBC/hpf   RBC / HPF  0-2  <3 RBC/hpf   Bacteria, UA MANY (*) RARE   Squamous Epithelial / LPF MANY (*) RARE   Urine-Other MUCOUS PRESENT    POCT PREGNANCY, URINE      Component Value Range   Preg Test, Ur NEGATIVE  NEGATIVE      1. Back sprain or strain       MDM    New Prescriptions   SULFAMETHOXAZOLE-TRIMETHOPRIM (SEPTRA DS) 800-160 MG PER TABLET  Take 1 tablet by mouth 2 (two) times daily.     26 yo F in in no acute distress, afebrile, vital signs stable, non toxic appearing who presents with  Couple days hx of dysuria and frequency. No fevers, nausea or vomiting.  UA with possible UTI. Doubt renal stone. Abdominal exam with no TTP, guarding or rebound. No pelvic pain. Thorough discussion with patient including return precautions. Patient expressed understanding.        Nadara Mustard, MD 05/03/12 2328

## 2012-05-03 NOTE — ED Notes (Addendum)
Pt reports for about a month that she has been having L lower abd pain that is now radiating into L side; pt reports hurts with deep breath, also states that urine smells--denies dysuria, abnormal discharge,n/v/d, fevers; when pulled pt to triage room--pt was eating meal, and spoke with pt that she needs to be NPO, pt upset, but stopped eating

## 2012-05-03 NOTE — ED Provider Notes (Addendum)
26 year old female had onset today of severe, generalized abdominal pain which resolved but now has moved into the left flank area. There is no associated nausea, vomiting, diaphoresis. There is no urinary urgency, frequency, and tenesmus. She states her urine does smell funny and she is worried she might have a urinary tract infection. On exam, lungs are clear and heart has regular rate and rhythm. Abdomen is soft and nontender. There is a tenderness to palpation throughout the left paralumbar area and costovertebral angle area without definite CVA tenderness. Urinalysis is unremarkable, but it is a contaminated specimen. She'll be given a therapeutic trial of ketorolac and is reassured that there is no evidence of serious pathology at this point.  Dione Booze, MD 05/03/12 2130  I saw and evaluated the patient, reviewed the resident's note and I agree with the findings and plan.  Dione Booze, MD 05/04/12 [phone]

## 2012-05-09 ENCOUNTER — Encounter (HOSPITAL_COMMUNITY): Payer: Self-pay | Admitting: *Deleted

## 2012-05-09 ENCOUNTER — Emergency Department (HOSPITAL_COMMUNITY)
Admission: EM | Admit: 2012-05-09 | Discharge: 2012-05-10 | Disposition: A | Discharge status: Home / Self Care | Payer: Self-pay | Attending: Emergency Medicine | Admitting: Emergency Medicine

## 2012-05-09 ENCOUNTER — Emergency Department (HOSPITAL_COMMUNITY): Payer: Self-pay

## 2012-05-09 DIAGNOSIS — M940 Chondrocostal junction syndrome [Tietze]: Secondary | ICD-10-CM | POA: Insufficient documentation

## 2012-05-09 DIAGNOSIS — R079 Chest pain, unspecified: Secondary | ICD-10-CM | POA: Insufficient documentation

## 2012-05-09 DIAGNOSIS — R0609 Other forms of dyspnea: Secondary | ICD-10-CM | POA: Insufficient documentation

## 2012-05-09 DIAGNOSIS — R0989 Other specified symptoms and signs involving the circulatory and respiratory systems: Secondary | ICD-10-CM | POA: Insufficient documentation

## 2012-05-09 DIAGNOSIS — F17200 Nicotine dependence, unspecified, uncomplicated: Secondary | ICD-10-CM | POA: Insufficient documentation

## 2012-05-09 DIAGNOSIS — R109 Unspecified abdominal pain: Secondary | ICD-10-CM | POA: Insufficient documentation

## 2012-05-09 LAB — CBC WITH DIFFERENTIAL/PLATELET
Basophils Absolute: 0 10*3/uL (ref 0.0–0.1)
Basophils Relative: 0 % (ref 0–1)
Eosinophils Absolute: 0.1 10*3/uL (ref 0.0–0.7)
Eosinophils Relative: 1 % (ref 0–5)
Lymphs Abs: 1.9 10*3/uL (ref 0.7–4.0)
MCH: 26.1 pg (ref 26.0–34.0)
MCHC: 32.1 g/dL (ref 30.0–36.0)
MCV: 81.3 fL (ref 78.0–100.0)
Platelets: 276 10*3/uL (ref 150–400)
RDW: 16.1 % — ABNORMAL HIGH (ref 11.5–15.5)

## 2012-05-09 LAB — URINALYSIS, ROUTINE W REFLEX MICROSCOPIC
Bilirubin Urine: NEGATIVE
Ketones, ur: NEGATIVE mg/dL
Nitrite: NEGATIVE
Protein, ur: NEGATIVE mg/dL
Urobilinogen, UA: 0.2 mg/dL (ref 0.0–1.0)

## 2012-05-09 LAB — BASIC METABOLIC PANEL
Calcium: 9.5 mg/dL (ref 8.4–10.5)
GFR calc non Af Amer: >90 mL/min (ref >90)
Glucose, Bld: 93 mg/dL (ref 70–99)
Sodium: 138 mEq/L (ref 135–145)

## 2012-05-09 MED ORDER — IBUPROFEN 800 MG PO TABS: 800.0000 mg | ORAL_TABLET | Freq: Three times a day (TID) | ORAL | Status: AC

## 2012-05-09 MED ORDER — IBUPROFEN 800 MG PO TABS
800.0000 mg | ORAL_TABLET | Freq: Once | ORAL | Status: AC
  Administered 2012-05-09: 800 mg via ORAL
  Filled 2012-05-09: qty 1

## 2012-05-09 NOTE — ED Notes (Signed)
Pt given ginger ale per Dr.Yoder

## 2012-05-09 NOTE — ED Notes (Signed)
Pt is here with bilateral flank pain and states pain with deep breath.  No urinary symptoms.  No vaginal discharge or breathing

## 2012-05-09 NOTE — ED Notes (Signed)
EMD at bedside.
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
## 2012-05-09 NOTE — ED Notes (Signed)
PT reports constipation

## 2012-05-10 NOTE — ED Provider Notes (Signed)
I saw and evaluated the patient, reviewed the resident's note and I agree with the findings and plan.   Loren Racer, MD 05/10/12 1531

## 2013-05-23 ENCOUNTER — Encounter (HOSPITAL_COMMUNITY): Payer: Self-pay | Admitting: Emergency Medicine

## 2013-05-23 ENCOUNTER — Emergency Department (INDEPENDENT_AMBULATORY_CARE_PROVIDER_SITE_OTHER)
Admission: EM | Admit: 2013-05-23 | Discharge: 2013-05-23 | Disposition: A | Discharge status: Home / Self Care | Payer: Self-pay | Source: Home / Self Care | Attending: Emergency Medicine | Admitting: Emergency Medicine

## 2013-05-23 DIAGNOSIS — J029 Acute pharyngitis, unspecified: Secondary | ICD-10-CM

## 2013-05-23 DIAGNOSIS — H6590 Unspecified nonsuppurative otitis media, unspecified ear: Secondary | ICD-10-CM

## 2013-05-23 LAB — POCT URINALYSIS DIP (DEVICE)
Bilirubin Urine: NEGATIVE
Leukocytes, UA: NEGATIVE
Nitrite: POSITIVE — AB
pH: 6.5 (ref 5.0–8.0)

## 2013-05-23 LAB — POCT PREGNANCY, URINE: Preg Test, Ur: NEGATIVE

## 2013-05-23 MED ORDER — METHYLPREDNISOLONE 4 MG PO KIT: PACK | ORAL | Status: DC

## 2013-05-23 MED ORDER — FLUTICASONE PROPIONATE 50 MCG/ACT NA SUSP: 2.0000 | Freq: Every day | NASAL | Status: DC

## 2013-05-23 NOTE — ED Provider Notes (Signed)
Medical screening examination/treatment/procedure(s) were performed by non-physician practitioner and as supervising physician I was immediately available for consultation/collaboration.  Leslee Home, M.D.  Reuben Likes, MD 05/23/13 [phone]

## 2013-05-23 NOTE — ED Notes (Signed)
Pt c/o sore throat onset 4 days Denies: f/v/n/d, cold sxs, SOB, wheezing Also c/o foul urine odor... Denies: dysuria She is alert w/no signs of acute distress.

## 2013-05-23 NOTE — ED Provider Notes (Signed)
CSN: [id number]     Arrival date & time 05/23/13  4098 History   First MD Initiated Contact with Patient 05/23/13 1019     Chief Complaint  Patient presents with  . Sore Throat   (Consider location/radiation/quality/duration/timing/severity/associated sxs/prior Treatment) HPI Comments: 27 year old female presents complaining of sore throat since 4 days ago. She also admits to some pressure in her left ear and some soreness in the left side of her neck. These symptoms have been constant since they began. She is not taking any medicine to try to help them. There are no exacerbating or alleviating factors. She denies fever, chills, sick contacts, recent travel, or any other systemic symptoms.  Additionally, she wants to make sure she does not have a urinary tract infection. For 2 months, she has had dark and foul-smelling urine. She believes this may be because she only drinks soda and never drinks any water. Denies any abdominal pain, hematuria, dysuria, flank pain. No vaginal discharge or risk factors for STDs.  Patient is a 27 y.o. female presenting with pharyngitis.  Sore Throat Pertinent negatives include no chest pain, no abdominal pain and no shortness of breath.    History reviewed. No pertinent past medical history. History reviewed. No pertinent past surgical history. No family history on file. History  Substance Use Topics  . Smoking status: Current Every Day Smoker -- 0.30 packs/day    Types: Cigarettes  . Smokeless tobacco: Not on file  . Alcohol Use: Yes     Comment: Occasional Drinker   OB History   Grav Para Term Preterm Abortions TAB SAB Ect Mult Living                 Review of Systems  Constitutional: Negative for fever and chills.  HENT: Positive for ear pain (pressure) and sore throat. Negative for postnasal drip, rhinorrhea and sinus pressure.   Eyes: Negative for visual disturbance.  Respiratory: Negative for cough and shortness of breath.   Cardiovascular:  Negative for chest pain, palpitations and leg swelling.  Gastrointestinal: Negative for nausea, vomiting and abdominal pain.  Endocrine: Negative for polydipsia and polyuria.  Genitourinary: Negative for dysuria, urgency, frequency, flank pain, vaginal discharge and pelvic pain.  Musculoskeletal: Positive for neck pain. Negative for arthralgias and myalgias.  Skin: Negative for rash.  Neurological: Negative for dizziness, weakness and light-headedness.    Allergies  Sudafed  Home Medications   Current Outpatient Rx  Name  Route  Sig  Dispense  Refill  . fluticasone (FLONASE) 50 MCG/ACT nasal spray   Nasal   Place 2 sprays into the nose daily.   1 g   2   . methylPREDNISolone (MEDROL DOSEPAK) 4 MG tablet      follow package directions   21 tablet   0     Dispense as written.    BP 137/74  Pulse 79  Temp(Src) 98.2 F (36.8 C) (Oral)  Resp 18  SpO2 100%  LMP 05/16/2013 Physical Exam  Nursing note and vitals reviewed. Constitutional: She is oriented to person, place, and time. Vital signs are normal. She appears well-developed and well-nourished. No distress.  HENT:  Head: Normocephalic and atraumatic.  Right Ear: Hearing, tympanic membrane, external ear and ear canal normal.  Left Ear: Hearing, external ear and ear canal normal. A middle ear effusion (serous) is present.  Nose: Nose normal. Right sinus exhibits no maxillary sinus tenderness and no frontal sinus tenderness. Left sinus exhibits no maxillary sinus tenderness and no frontal sinus  tenderness.  Mouth/Throat: Uvula is midline, oropharynx is clear and moist and mucous membranes are normal.  Pulmonary/Chest: Effort normal. No respiratory distress.  Lymphadenopathy:       Head (right side): No tonsillar adenopathy present.       Head (left side): Tonsillar (tender) adenopathy present.  Neurological: She is alert and oriented to person, place, and time. She has normal strength. Coordination normal.  Skin: Skin  is warm and dry. No rash noted. She is not diaphoretic.  Psychiatric: She has a normal mood and affect. Judgment normal.    ED Course  Procedures (including critical care time) Labs Review Labs Reviewed  POCT URINALYSIS DIP (DEVICE) - Abnormal; Notable for the following:    Hgb urine dipstick TRACE (*)    Nitrite POSITIVE (*)    All other components within normal limits  URINE CULTURE  POCT RAPID STREP A ([facility] ONLY)  POCT PREGNANCY, URINE   Imaging Review No results found.    MDM   1. Pharyngitis   2. Serous otitis media, left    Patient is afebrile. She does have a single inflamed lymph node that is most likely causing serous otitis media. Treating with steroids, NSAIDs, Flonase, antihistamine. Followup if not improving   Meds ordered this encounter  Medications  . methylPREDNISolone (MEDROL DOSEPAK) 4 MG tablet    Sig: follow package directions    Dispense:  21 tablet    Refill:  0    Order Specific Question:  Supervising Provider    Answer:  Lorenz Coaster, DAVID C [id number]  . fluticasone (FLONASE) 50 MCG/ACT nasal spray    Sig: Place 2 sprays into the nose daily.    Dispense:  1 g    Refill:  2    Order Specific Question:  Supervising Provider    Answer:  Lorenz Coaster, DAVID C [[id number]       Graylon Good, PA-C 05/23/13 1046

## 2013-05-26 LAB — CULTURE, GROUP A STREP

## 2013-05-27 ENCOUNTER — Telehealth (HOSPITAL_COMMUNITY): Payer: Self-pay | Admitting: *Deleted

## 2013-05-27 MED ORDER — PENICILLIN V POTASSIUM 500 MG PO TABS: 500.0000 mg | ORAL_TABLET | Freq: Four times a day (QID) | ORAL | Status: DC

## 2013-05-27 NOTE — ED Notes (Signed)
Throat culture: Strep beta hemolytic not group A.  Lab shown to Langston Masker PA and she e-prescribed PCN to the [facility] at [facility]. I called pt. and left a message to call. Vassie Moselle 05/27/2013

## 2013-05-28 NOTE — ED Notes (Signed)
I called pt. Pt. verified x 2 and given result.  Pt. told she has a Rx. of Pen V K at the [facility] at [facility]. Pt. told to tell anyone she has exposed to get checked if the get the same symptoms.  Pt. asked about her other test. I told her the urine culture said " needs to be collected."  I told her appeared that her urine sample was not sent to the lab for culture.  I asked about her symptoms.  She said she did not have any but her urine is dark with an odor. Pt. instructed to drink lots of water to decrease the concentration of her urine.  I reviewed symptoms of UTI and told her to come back if she gets these symptoms, so we can obtain another urine sample.  Pt. voiced understanding. Vassie Moselle 05/28/2013

## 2013-09-27 ENCOUNTER — Encounter (HOSPITAL_COMMUNITY): Payer: Self-pay | Admitting: Emergency Medicine

## 2013-09-27 ENCOUNTER — Encounter (HOSPITAL_COMMUNITY): Payer: Self-pay

## 2013-09-27 ENCOUNTER — Emergency Department (HOSPITAL_COMMUNITY)
Admission: EM | Admit: 2013-09-27 | Discharge: 2013-09-27 | Disposition: A | Discharge status: Other Acute Inpatient Hospital | Payer: Self-pay | Attending: Emergency Medicine | Admitting: Emergency Medicine

## 2013-09-27 ENCOUNTER — Inpatient Hospital Stay (HOSPITAL_COMMUNITY)
Admission: AD | Admit: 2013-09-27 | Discharge: 2013-10-02 | DRG: 885 | Disposition: A | Discharge status: Home / Self Care | Payer: Federal, State, Local not specified - Other | Attending: Psychiatry | Admitting: Psychiatry

## 2013-09-27 DIAGNOSIS — R45850 Homicidal ideations: Secondary | ICD-10-CM | POA: Insufficient documentation

## 2013-09-27 DIAGNOSIS — F32A Depression, unspecified: Secondary | ICD-10-CM

## 2013-09-27 DIAGNOSIS — F1210 Cannabis abuse, uncomplicated: Secondary | ICD-10-CM | POA: Insufficient documentation

## 2013-09-27 DIAGNOSIS — R45851 Suicidal ideations: Secondary | ICD-10-CM | POA: Insufficient documentation

## 2013-09-27 DIAGNOSIS — F17200 Nicotine dependence, unspecified, uncomplicated: Secondary | ICD-10-CM | POA: Insufficient documentation

## 2013-09-27 DIAGNOSIS — F29 Unspecified psychosis not due to a substance or known physiological condition: Secondary | ICD-10-CM

## 2013-09-27 DIAGNOSIS — K921 Melena: Secondary | ICD-10-CM | POA: Insufficient documentation

## 2013-09-27 DIAGNOSIS — F332 Major depressive disorder, recurrent severe without psychotic features: Principal | ICD-10-CM | POA: Diagnosis present

## 2013-09-27 DIAGNOSIS — G479 Sleep disorder, unspecified: Secondary | ICD-10-CM | POA: Insufficient documentation

## 2013-09-27 DIAGNOSIS — F1010 Alcohol abuse, uncomplicated: Secondary | ICD-10-CM | POA: Insufficient documentation

## 2013-09-27 DIAGNOSIS — F39 Unspecified mood [affective] disorder: Secondary | ICD-10-CM

## 2013-09-27 DIAGNOSIS — F329 Major depressive disorder, single episode, unspecified: Secondary | ICD-10-CM | POA: Insufficient documentation

## 2013-09-27 DIAGNOSIS — F3289 Other specified depressive episodes: Secondary | ICD-10-CM | POA: Insufficient documentation

## 2013-09-27 HISTORY — DX: Major depressive disorder, single episode, unspecified: F32.9

## 2013-09-27 HISTORY — DX: Depression, unspecified: F32.A

## 2013-09-27 LAB — ETHANOL: Alcohol, Ethyl (B): <11 mg/dL (ref 0–11)

## 2013-09-27 LAB — COMPREHENSIVE METABOLIC PANEL
ALT: 9 U/L (ref 0–35)
AST: 16 U/L (ref 0–37)
Albumin: 4.1 g/dL (ref 3.5–5.2)
Alkaline Phosphatase: 68 U/L (ref 39–117)
BUN: 7 mg/dL (ref 6–23)
CO2: 24 meq/L (ref 19–32)
Calcium: 9.2 mg/dL (ref 8.4–10.5)
Chloride: 100 mEq/L (ref 96–112)
Creatinine, Ser: 0.77 mg/dL (ref 0.50–1.10)
GFR calc Af Amer: >90 mL/min (ref >90)
GLUCOSE: 84 mg/dL (ref 70–99)
Potassium: 3.7 mEq/L (ref 3.7–5.3)
SODIUM: 138 meq/L (ref 137–147)
Total Bilirubin: 0.7 mg/dL (ref 0.3–1.2)
Total Protein: 7.5 g/dL (ref 6.0–8.3)

## 2013-09-27 LAB — CBC
HCT: 41.7 % (ref 36.0–46.0)
HEMOGLOBIN: 13.6 g/dL (ref 12.0–15.0)
MCH: 27.1 pg (ref 26.0–34.0)
MCHC: 32.6 g/dL (ref 30.0–36.0)
MCV: 83.2 fL (ref 78.0–100.0)
Platelets: 252 10*3/uL (ref 150–400)
RBC: 5.01 MIL/uL (ref 3.87–5.11)
RDW: 15.8 % — ABNORMAL HIGH (ref 11.5–15.5)
WBC: 6.8 10*3/uL (ref 4.0–10.5)

## 2013-09-27 LAB — RAPID URINE DRUG SCREEN, HOSP PERFORMED
Amphetamines: NOT DETECTED
BARBITURATES: NOT DETECTED
Benzodiazepines: NOT DETECTED
Cocaine: NOT DETECTED
Opiates: NOT DETECTED
Tetrahydrocannabinol: POSITIVE — AB

## 2013-09-27 LAB — POC OCCULT BLOOD, ED: FECAL OCCULT BLD: POSITIVE — AB

## 2013-09-27 MED ORDER — ONDANSETRON HCL 4 MG PO TABS: 4.0000 mg | ORAL_TABLET | Freq: Three times a day (TID) | ORAL | Status: DC | PRN

## 2013-09-27 MED ORDER — ZOLPIDEM TARTRATE 5 MG PO TABS: 5.0000 mg | ORAL_TABLET | Freq: Every evening | ORAL | Status: DC | PRN

## 2013-09-27 MED ORDER — IBUPROFEN 200 MG PO TABS: 600.0000 mg | ORAL_TABLET | Freq: Three times a day (TID) | ORAL | Status: DC | PRN

## 2013-09-27 MED ORDER — ARIPIPRAZOLE 10 MG PO TABS
10.0000 mg | ORAL_TABLET | Freq: Every day | ORAL | Status: DC
  Administered 2013-09-27: 10 mg via ORAL
  Filled 2013-09-27: qty 1

## 2013-09-27 MED ORDER — ALUM & MAG HYDROXIDE-SIMETH 200-200-20 MG/5ML PO SUSP: 30.0000 mL | ORAL | Status: DC | PRN

## 2013-09-27 MED ORDER — NICOTINE 21 MG/24HR TD PT24
21.0000 mg | MEDICATED_PATCH | Freq: Every day | TRANSDERMAL | Status: DC
  Filled 2013-09-27 (×8): qty 1

## 2013-09-27 MED ORDER — MAGNESIUM HYDROXIDE 400 MG/5ML PO SUSP: 30.0000 mL | Freq: Every day | ORAL | Status: DC | PRN

## 2013-09-27 MED ORDER — ACETAMINOPHEN 325 MG PO TABS
650.0000 mg | ORAL_TABLET | ORAL | Status: DC | PRN
Start: 2013-09-27 — End: 2013-10-02

## 2013-09-27 MED ORDER — ZOLPIDEM TARTRATE 5 MG PO TABS
5.0000 mg | ORAL_TABLET | Freq: Every evening | ORAL | Status: DC | PRN
  Administered 2013-09-27 – 2013-10-01 (×5): 5 mg via ORAL
  Filled 2013-09-27 (×5): qty 1

## 2013-09-27 MED ORDER — ARIPIPRAZOLE 10 MG PO TABS
10.0000 mg | ORAL_TABLET | Freq: Every day | ORAL | Status: DC
  Administered 2013-09-28 – 2013-10-02 (×5): 10 mg via ORAL
  Filled 2013-09-27 (×8): qty 1

## 2013-09-27 MED ORDER — LORAZEPAM 1 MG PO TABS: 1.0000 mg | ORAL_TABLET | Freq: Three times a day (TID) | ORAL | Status: DC | PRN

## 2013-09-27 MED ORDER — NICOTINE 21 MG/24HR TD PT24: 21.0000 mg | MEDICATED_PATCH | Freq: Every day | TRANSDERMAL | Status: DC

## 2013-09-27 MED ORDER — IBUPROFEN 600 MG PO TABS: 600.0000 mg | ORAL_TABLET | Freq: Three times a day (TID) | ORAL | Status: DC | PRN

## 2013-09-27 MED ORDER — ACETAMINOPHEN 325 MG PO TABS: 650.0000 mg | ORAL_TABLET | ORAL | Status: DC | PRN

## 2013-09-27 NOTE — Progress Notes (Signed)
[facility] did not get to see patient but will be sending information about the [facility] Orange Card program, using the address provided.  °

## 2013-09-27 NOTE — ED Notes (Signed)
[facility] transported pt to [facility] 

## 2013-09-27 NOTE — Progress Notes (Signed)
[facility] Group Notes:  (Nursing/MHT/Case Management/Adjunct)  Date:  09/27/2013  Time:  11:32 PM  Type of Therapy:  Group Therapy  Participation Level:  Active  Participation Quality:  Appropriate and Sharing  Affect:  Appropriate  Cognitive:  Oriented  Insight:  Improving  Engagement in Group:  Developing/Improving  Modes of Intervention:  Orientation, Socialization and Support  Summary of Progress/Problems: Pt. Was recently admitted to the facility.  Pt. Stated she wanted to learn coping skills for her anxiety.  Sondra ComeWilson, Starlene Consuegra J 09/27/2013, 11:32 PM
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
## 2013-09-27 NOTE — Progress Notes (Signed)
Recreation Therapy Notes  Date: 02.20.2015 Time: 2:45pm Location: [address] Hall Dayroom   Group Topic: Communication, Team Building, Problem Solving  Goal Area(s) Addresses:  Patient will effectively work with peer towards shared goal.  Patient will identify skill used to make activity successful.  Patient will identify how skills used during activity can be used to reach post d/c goals.   Behavioral Response: Did not attend.   Tim Wilhide L Rae Sutcliffe, LRT/CTRS  Sharetta Ricchio L 09/27/2013 4:21 PM 

## 2013-09-27 NOTE — ED Notes (Addendum)
MD notified of +hemoccult

## 2013-09-27 NOTE — ED Notes (Signed)
Called [employer] for transport. 

## 2013-09-27 NOTE — ED Notes (Signed)
Bed: [facility]4 Expected date:  Expected time:  Means of arrival:  Comments: [facility]- Multiple Complaints

## 2013-09-27 NOTE — ED Provider Notes (Signed)
CSN: [id number]     Arrival date & time 09/27/13  0108 History   First MD Initiated Contact with Patient 09/27/13 [phone]     Chief Complaint  Patient presents with  . Medical Clearance  . Rectal Bleeding     (Consider location/radiation/quality/duration/timing/severity/associated sxs/prior Treatment) HPI Comments: Patient is a 28 yo F presenting to the ED from [facility] via [facility] for multiple complaints. Patient's first complaint is that she is seeking help for depression, suicidal ideations, and homicidal ideations. She denies any plan for SI or HI. She states these ideas have been fleeting over the last day or so. Patient denies any hallucinations, self injury. She does endorse marijuana and ETOH use recently. Patient is complaining about bright red blood noticed every time she wipes after using the restroom. Patient states that this happened once before and it disappeared with time. Patient states she has noticed this intermittently over the last few days. She does endorse that she just began her menstrual cycle. She denies any black stools or red blood in her BMs. She denies any abdominal pain, nausea, vomiting, diarrhea, fevers. No other complaints at this time.   Patient is a 28 y.o. female presenting with hematochezia.  Rectal Bleeding Associated symptoms: no abdominal pain, no fever and no vomiting     History reviewed. No pertinent past medical history. History reviewed. No pertinent past surgical history. Family History  Problem Relation Age of Onset  . Diabetes Other   . Hypertension Other   . Stroke Other    History  Substance Use Topics  . Smoking status: Current Every Day Smoker -- 0.30 packs/day    Types: Cigarettes  . Smokeless tobacco: Not on file  . Alcohol Use: Yes     Comment: Occasional Drinker   OB History   Grav Para Term Preterm Abortions TAB SAB Ect Mult Living                 Review of Systems  Constitutional: Negative for fever and chills.   Gastrointestinal: Positive for hematochezia and anal bleeding. Negative for nausea, vomiting, abdominal pain, diarrhea, constipation and rectal pain.  Genitourinary: Positive for vaginal bleeding (menstrual cycle).  Psychiatric/Behavioral: Positive for suicidal ideas and sleep disturbance.  All other systems reviewed and are negative.      Allergies  Sudafed  Home Medications  No current outpatient prescriptions on file. BP 112/67  Pulse 73  Temp(Src) 97.8 F (36.6 C) (Oral)  Resp 16  SpO2 100%  LMP 09/27/2013 Physical Exam  Nursing note and vitals reviewed. Constitutional: She is oriented to person, place, and time. She appears well-developed and well-nourished. No distress.  HENT:  Head: Normocephalic and atraumatic.  Right Ear: External ear normal.  Left Ear: External ear normal.  Nose: Nose normal.  Mouth/Throat: Oropharynx is clear and moist.  Eyes: Conjunctivae are normal.  Neck: Normal range of motion. Neck supple.  Cardiovascular: Normal rate, regular rhythm and normal heart sounds.   Pulmonary/Chest: Effort normal and breath sounds normal. No respiratory distress.  Abdominal: Soft. Bowel sounds are normal. She exhibits no distension. There is no tenderness. There is no rebound and no guarding.  Genitourinary: Rectal exam shows no external hemorrhoid, no internal hemorrhoid, no fissure, no mass, no tenderness and anal tone normal. Guaiac positive stool.  Brown stool.   Musculoskeletal: Normal range of motion.  Neurological: She is alert and oriented to person, place, and time.  Skin: Skin is warm and dry. She is not diaphoretic.  Psychiatric: She  has a normal mood and affect.    ED Course  Procedures (including critical care time) Labs Review Labs Reviewed  CBC - Abnormal; Notable for the following:    RDW 15.8 (*)    All other components within normal limits  URINE RAPID DRUG SCREEN (HOSP PERFORMED) - Abnormal; Notable for the following:     Tetrahydrocannabinol POSITIVE (*)    All other components within normal limits  COMPREHENSIVE METABOLIC PANEL  ETHANOL  OCCULT BLOOD X 1 CARD TO LAB, STOOL   Imaging Review No results found.  EKG Interpretation   None       MDM   Final diagnoses:  Depression    Filed Vitals:   09/27/13 0111  BP: 112/67  Pulse: 73  Temp: 97.8 F (36.6 C)  Resp: 16    Pt presents to the ED for medical clearance.  Pt is currently having SI or HI ideations. Pt does not have a plan. The patients demeanor is dysphoric. Admits difficulty sleeping, loss of interests, feelings of worthlessness, lack of energy, difficulty concentrating, loss of appetite, feelings of anxiety. The patient currently does have a acute physical complaints but is in no acute distress. Patient complaining of BRBPR. DRE unremarkable. Brown stool appreciated, no melena. Hgb and Hct stable. Patient on menstrual cycle, likely source of positive guaiac. The patients demeanor is depressed. The [facility]. ACT consult was appreciated and pt was moved to Psych ED for further evaluation.           Jeannetta EllisJennifer L Jaceon Heiberger, PA-C 09/27/13 [phone]
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
## 2013-09-27 NOTE — ED Notes (Signed)
Patient belongings, 2 bags, placed in TCU locker #26

## 2013-09-27 NOTE — ED Provider Notes (Signed)
Medical screening examination/treatment/procedure(s) were performed by non-physician practitioner and as supervising physician I was immediately available for consultation/collaboration.  EKG Interpretation   None        Latanga Nedrow K Arwyn Besaw-Rasch, MD 09/27/13 0613 

## 2013-09-27 NOTE — BH Assessment (Signed)
Pt accepted by Dr. Fredda HammedAktar. The attending physician is Dr. Elsie SaasJonnalagadda. The bed # is 501-1. Support paperwork needs to be completed. Writer notified TTS staff [facility].
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
## 2013-09-28 ENCOUNTER — Encounter (HOSPITAL_COMMUNITY): Payer: Self-pay | Admitting: Psychiatry

## 2013-09-28 DIAGNOSIS — F29 Unspecified psychosis not due to a substance or known physiological condition: Secondary | ICD-10-CM | POA: Diagnosis present

## 2013-09-28 DIAGNOSIS — F39 Unspecified mood [affective] disorder: Secondary | ICD-10-CM

## 2013-09-28 NOTE — Progress Notes (Signed)
Psychoeducational Group Note  Date: 09/28/2013 Time:  1015  Group Topic/Focus:  Identifying Needs:   The focus of this group is to help patients identify their personal needs that have been historically problematic and identify healthy behaviors to address their needs.  Participation Level:  Active  Participation Quality:  Appropriate  Affect:  Appropriate  Cognitive:  Oriented  Insight:  Improving  Engagement in Group:  Engaged  Additional Comments:  Attended and participated in the group  Chianti Goh A  
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
## 2013-09-28 NOTE — [facility] Suicide Risk Assessment (Signed)
Suicide Risk Assessment  Admission Assessment     Nursing information obtained from:    Demographic factors:    Current Mental Status:    Loss Factors:    Historical Factors:    Risk Reduction Factors:    Total Time spent with patient: 45 minutes  CLINICAL FACTORS:   Depression:   Impulsivity Insomnia Currently Psychotic  Psychiatric Specialty Exam:     Blood pressure 141/95, pulse 86, temperature 97.8 F (36.6 C), resp. rate 18, height 5\' 2"  (1.575 m), weight 90.039 kg (198 lb 8 oz), last menstrual period 09/27/2013, SpO2 99.00%.Body mass index is 36.3 kg/(m^2).  General Appearance: Fairly Groomed  Patent attorneyye Contact::  Fair  Speech:  rapid  Volume:  fluctuates  Mood:  Anxious, Depressed and worried  Affect:  Restricted  Thought Process:  Coherent and Goal Directed  Orientation:  Full (Time, Place, and Person)  Thought Content:  Delusions and symptoms, worries, concerns  Suicidal Thoughts:  No  Homicidal Thoughts:  No  Memory:  Immediate;   Fair Recent;   Fair Remote;   Fair  Judgement:  Fair  Insight:  superficial  Psychomotor Activity:  Restlessness  Concentration:  Fair  Recall:  FiservFair  Fund of Knowledge:NA  Language: Fair  Akathisia:  No  Handed:    AIMS (if indicated):     Assets:  Desire for Improvement Vocational/Educational  Sleep:  Number of Hours: 4.75   Musculoskeletal: Strength & Muscle Tone: within normal limits Gait & Station: normal Patient leans: N/A  COGNITIVE FEATURES THAT CONTRIBUTE TO RISK:  Closed-mindedness Polarized thinking Thought constriction (tunnel vision)    SUICIDE RISK:   Moderate:  Frequent suicidal ideation with limited intensity, and duration, some specificity in terms of plans, no associated intent, good self-control, limited dysphoria/symptomatology, some risk factors present, and identifiable protective factors, including available and accessible social support.  PLAN OF CARE: Supportive approach/coping skills                  Get collateral information                               CBT; mindfulness                               Pursue the Abilify  I certify that inpatient services furnished can reasonably be expected to improve the patient's condition.  Keyden Pavlov A 09/28/2013, 2:16 PM
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
## 2013-09-28 NOTE — Progress Notes (Signed)
Adult Psychoeducational Group Note  Date:  09/28/2013 Time:  9:14 PM  Group Topic/Focus:  Wrap-Up Group:   The focus of this group is to help patients review their daily goal of treatment and discuss progress on daily workbooks.  Participation Level:  Active  Participation Quality:  Appropriate  Affect:  Appropriate  Cognitive:  Appropriate  Insight: Appropriate  Engagement in Group:  Engaged  Modes of Intervention:  Discussion  Additional Comments:  The patient expressed in group that she is able to accomplish anything.The patient also said that the group earlier was too long.  Octavio Mannshigpen, Tamme Mozingo Lee 09/28/2013, 9:14 PM

## 2013-09-28 NOTE — Progress Notes (Signed)
Patient alert. C/o feeling queasy. States she had not been eating right recently. Given Ginger Ale. Patient safety maintained, Q 15 checks continue.

## 2013-09-28 NOTE — [facility] Group Notes (Signed)
[facility] Group Notes: (Clinical Social Work)   09/28/2013      Type of Therapy:  Group Therapy   Participation Level:  Did Not Attend    Savannah MantleMareida Grossman-Orr, LCSW 09/28/2013, 3:21 PM
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
## 2013-09-28 NOTE — Progress Notes (Signed)
D: Pt mood is depressed and agitated. She states that she was upset about something but declines offer to talk about it. Pt has been attending groups.  A: Support given. Verbalization encouraged. Pt encouraged to come to staff with any concerns. Medications given as ordered. R: Pt is responsive. No complaints of pain or discomfort at this time. Q15 min safety checks maintained. Will continue to monitor pt.

## 2013-09-28 NOTE — [facility] Group Notes (Signed)
[facility] Group Notes:  (Nursing/MHT/Case Management/Adjunct)  Date:  09/28/2013  Time:  10:50 AM  Type of Therapy:  Nurse Education  Participation Level:  Active  Participation Quality:  Appropriate  Affect:  Appropriate  Cognitive:  Alert  Insight:  Appropriate  Engagement in Group:  Engaged  Modes of Intervention:  Education  Summary of Progress/Problems:Pt attended the group on the 500 hall.   Rodman KeyWebb, Ryheem Jay [facility] CenterGuyes 09/28/2013, 10:50 AM

## 2013-09-29 DIAGNOSIS — F332 Major depressive disorder, recurrent severe without psychotic features: Principal | ICD-10-CM

## 2013-09-29 DIAGNOSIS — F1010 Alcohol abuse, uncomplicated: Secondary | ICD-10-CM

## 2013-09-29 NOTE — Progress Notes (Signed)
[facility]  09/29/2013 2:17 PM Savannah CreekBritney L [name]  MRN:  [id number] Subjective:  Patient reports that she slept poorly last night but has been so sleepy today she doses during groups.  She thinks it related to her Abilify but "will get used to it".   She scales her depression 1/10 and anxiety 0/10 today.  She denies auditory or visual hallucinations.  She denies SI.  She has no feelings of possession that was "just my way of explaining that I was in a bad place". Diagnosis:   DSM5: Trauma-Stressor Disorders:  Posttraumatic Stress Disorder (309.81) Substance/Addictive Disorders:  Alcohol Related Disorder - Moderate (303.90) and Cannabis Use Disorder - Moderate 9304.30) Depressive Disorders:  Major Depressive Disorder - with Psychotic Features (296.24) Total Time spent with patient: 30 minutes  Axis I: Alcohol Abuse, Major Depression, Recurrent severe and Substance Abuse Axis II: Deferred Axis III:  Past Medical History  Diagnosis Date  . Depression    Axis IV: economic problems, other psychosocial or environmental problems, problems related to social environment, problems with access to health care services and problems with primary support group Axis V: 21-30 behavior considerably influenced by delusions or hallucinations OR serious impairment in judgment, communication OR inability to function in almost all areas  ADL's:  Intact  Sleep: Poor  Appetite:  Good  Suicidal Ideation:  -Denies presently, contracts for safety Homicidal Ideation:  -Denies AEB (as evidenced by):  Psychiatric Specialty Exam: Physical Exam  Constitutional: She is oriented to person, place, and time. She appears well-developed and well-nourished.  HENT:  Head: Normocephalic and atraumatic.  Neck: Normal range of motion. Neck supple.  Respiratory: Effort normal.  Musculoskeletal: Normal range of motion.  Neurological: She is alert and oriented to person, place, and time.  Skin: Skin is warm and  dry.    ROS  Blood pressure 134/93, pulse 99, temperature 97.8 F (36.6 C), resp. rate 20, height 5\' 2"  (1.575 m), weight 90.039 kg (198 lb 8 oz), last menstrual period 09/27/2013, SpO2 99.00%.Body mass index is 36.3 kg/(m^2).  General Appearance: Disheveled  Eye SolicitorContact::  Fair  Speech:  Slow  Volume:  Decreased  Mood:  Depressed  Affect:  Flat  Thought Process:  Negative  Orientation:  Full (Time, Place, and Person)  Thought Content:  Negative  Suicidal Thoughts:  Nocontracts for safety  Homicidal Thoughts:  No  Memory:  Immediate;   Fair Recent;   Fair Remote;   Fair  Judgement:  Impaired  Insight:  Lacking  Psychomotor Activity:  Decreased  Concentration:  Fair  Recall:  FiservFair  Fund of Knowledge:Fair  Language: Fair  Akathisia:  No  Handed:  Right  AIMS (if indicated):     Assets:  Desire for Improvement Physical Health Resilience  Sleep:  Number of Hours: 6.5   Musculoskeletal: Strength & Muscle Tone: within normal limits Gait & Station: normal Patient leans: N/A  Current Medications: Current Facility-Administered Medications  Medication Dose Route Frequency Provider Last Rate Last Dose  . acetaminophen (TYLENOL) tablet 650 mg  650 mg Oral Q4H PRN Thresa RossNadeem Akhtar, MD      . alum & mag hydroxide-simeth (MAALOX/MYLANTA) 200-200-20 MG/5ML suspension 30 mL  30 mL Oral PRN Thresa RossNadeem Akhtar, MD      . ARIPiprazole (ABILIFY) tablet 10 mg  10 mg Oral Daily Thresa RossNadeem Akhtar, MD   10 mg at 09/29/13 0848  . ibuprofen (ADVIL,MOTRIN) tablet 600 mg  600 mg Oral Q8H PRN Thresa RossNadeem Akhtar, MD      .  LORazepam (ATIVAN) tablet 1 mg  1 mg Oral Q8H PRN Thresa Ross, MD      . magnesium hydroxide (MILK OF MAGNESIA) suspension 30 mL  30 mL Oral Daily PRN Thresa Ross, MD      . nicotine (NICODERM CQ - dosed in mg/24 hours) patch 21 mg  21 mg Transdermal Daily Thresa Ross, MD      . ondansetron (ZOFRAN) tablet 4 mg  4 mg Oral Q8H PRN Thresa Ross, MD      . zolpidem (AMBIEN) tablet 5 mg  5 mg  Oral QHS PRN Thresa Ross, MD   5 mg at 09/28/13 2157    Lab Results: No results found for this or any previous visit (from the past 48 hour(s)).  Physical Findings: AIMS: Facial and Oral Movements Muscles of Facial Expression: None, normal Lips and Perioral Area: None, normal Jaw: None, normal Tongue: None, normal,Extremity Movements Upper (arms, wrists, hands, fingers): None, normal Lower (legs, knees, ankles, toes): None, normal, Trunk Movements Neck, shoulders, hips: None, normal, Overall Severity Severity of abnormal movements (highest score from questions above): None, normal Incapacitation due to abnormal movements: None, normal Patient's awareness of abnormal movements (rate only patient's report): No Awareness, Dental Status Current problems with teeth and/or dentures?: No Does patient usually wear dentures?: No  CIWA:    COWS:     Treatment Plan Summary: Daily contact with patient to assess and evaluate symptoms and progress in treatment Medication management   Review of chart, vital signs, medications and notes   Plan: 1. Continue crisis management and stabilization. Estimated length of stay 5-7 days  2.  Medication management to reduce current symptoms to base line and improve patient's overall level of functioning     Medications reviewed with the pateint and side effect of sedation discussed, expect to improve with time    Individual and group therapy encouraged    Coping skills for depression, substance abuse, and anxiety 3.  Treat health problems as indicated. 4.  Develop treatment plan to decrease risk of relapse upon discharge and the need for readmission 5.  Psych-social education regarding relapse prevention and self care. 6.  Health care follow up as needed for medical problems 7.  Continue home medications where appropriate 8.  Disposition in progress  Plan:  Medical Decision Making Problem Points:  Established problem, stable/improving (1) and  Review of psycho-social stressors (1) Data Points:  Review of medication regiment & side effects (2) Review of new medications or change in dosage (2)  I certify that inpatient services furnished can reasonably be expected to improve the patient's condition.   Lorinda Creed PMHNP 09/29/2013, 2:17 PM Agree with assessment and plan Madie Reno A. Dub Mikes, M.D.

## 2013-09-29 NOTE — Progress Notes (Signed)
Adult Psychoeducational Group Note  Date:  09/29/2013 Time:  10:58 PM  Group Topic/Focus:  Wrap-Up Group:   The focus of this group is to help patients review their daily goal of treatment and discuss progress on daily workbooks.  Participation Level:  Active  Participation Quality:  Appropriate  Affect:  Appropriate  Cognitive:  Appropriate  Insight: Appropriate  Engagement in Group:  Supportive  Modes of Intervention:  Support  Additional Comments:  Pt mentioned that she enjoyed going to the gym and upon discharge she would like to find stable housing, go to a 28 day program and return to cosmetology school.   Jola Baptistbdin, Pearlena Ow 09/29/2013, 10:58 PM

## 2013-09-29 NOTE — Progress Notes (Signed)
Adult Psychoeducational Group Note  Date:  09/29/2013 Time:  3:05PM  Group Topic/Focus:  Making Healthy Choices:   The focus of this group is to help patients identify negative/unhealthy choices they were using prior to admission and identify positive/healthier coping strategies to replace them upon discharge.  Participation Level:  Did Not Attend  Additional Comments:  Pt did not attend group. Pt was in her room during group time. When informed that it was group time, pt acknowledged that and continued to stay in her room  Kaysia Willard K 09/29/2013, 3:13 PM

## 2013-09-29 NOTE — Progress Notes (Signed)
Savannah [name] has attended her goups today, she is engaged in her recovery as evidenced by her compliance with her meds and her poc, the questions that she asks as well as the processing she does today.   A She completes her morning inventory and on it she writes she deneis SI within the past 24 hrs, she rates her depression and hopelessness "5/2" and says her DC plan is " staying focused on the positive and do the right thing".   R safety is  In place and poc maintaiend.

## 2013-09-29 NOTE — Progress Notes (Signed)
Psychoeducational Group Note  Date: 09/29/2013 Time:  0930  Group Topic/Focus:  Gratefulness:  The focus of this group is to help patients identify what two things they are most grateful for in their lives. What helps ground them and to center them on their work to their recovery.  Participation Level:  Active  Participation Quality:  Appropriate  Affect:  Appropriate  Cognitive:  Oriented  Insight:  Improving  Engagement in Group:  Engaged  Additional Comments:  Participated fully in the group  Akeela Busk A   

## 2013-09-29 NOTE — [facility] Group Notes (Signed)
[facility] Group Notes:  (Clinical Social Work)  09/29/2013   1:15-2:15PM  Summary of Progress/Problems:  The main focus of today's process group was to   identify the patient's current support system and decide on other supports that can be put in place.  The picture on workbook was used to discuss why additional supports are needed.  An emphasis was placed on using counselor, doctor, therapy groups, 12-step groups, and problem-specific support groups to expand supports.   There was also an extensive discussion about what constitutes a healthy support versus an unhealthy support.  The patient expressed full comprehension of the concepts presented, and agreed that there is a need to add more supports.  The patient was very invested in group, was upset when she was called out to see another staff member.  She returned and participated fully again.  Type of Therapy:  Process Group  Participation Level:  Active  Participation Quality:  Attentive and Sharing  Affect:  Blunted  Cognitive:  Appropriate and Oriented  Insight:  Engaged  Engagement in Therapy:  Engaged  Modes of Intervention:  Education,  Support and ConAgra FoodsProcessing  Asmi Fugere Grossman-Orr, LCSW 09/29/2013, 4:00pm

## 2013-09-29 NOTE — Progress Notes (Signed)
Psychoeducational Group Note  Date:  09/29/2013 Time:  1015  Group Topic/Focus:  Making Healthy Choices:   The focus of this group is to help patients identify negative/unhealthy choices they were using prior to admission and identify positive/healthier coping strategies to replace them upon discharge.  Participation Level:  Active  Participation Quality:  Appropriate  Affect:  appropriate Cognitive:  Oriented  Insight:  Improving  Engagement in Group:  Engaged  Additional Comments:   Savannah [name] A 09/29/2013  

## 2013-09-29 NOTE — Progress Notes (Signed)
Pt observed frequently on the phone tonight. Loud, aggressive in speech. Later stated her friend wants her to get additional treatment upon discharge which she's agreeable to but says he is lecturing her about it. States she's actively looking at options and is seen holding phone book. Encouraged pt to speak with SW tomorrow and praised patient for being engaged in her recovery. Medicated with prn ambien per her request and per order. On reassess pt is lying in bed though still awake. She denies SI/HI/AVH and remains safe. Lawrence MarseillesFriedman, Daemian Gahm Eakes

## 2013-09-30 DIAGNOSIS — F913 Oppositional defiant disorder: Secondary | ICD-10-CM

## 2013-09-30 DIAGNOSIS — F411 Generalized anxiety disorder: Secondary | ICD-10-CM

## 2013-09-30 NOTE — [facility] Group Notes (Signed)
[facility]   09/30/2013 8:45 AM  Participation Quality:  Alert, Appropriate and Oriented  Mood/Affect:  Bright  Depression Rating: 1    Anxiety Rating:  1  Thoughts of Suicide:  Pt denies SI/HI  Will you contract for safety?   Yes  Current AVH:  Pt denies  Plan for Discharge/Comments:  Pt attended discharge planning group and actively participated in group.  CSW provided pt with today's workbook.  Pt reports feeling "excellent" today.  Pt is hopeful to d/c today and was observed to be filling out her d/c survey during group.  [place] and is [facility] and [facility].  No further needs voiced by pt at this time.    Transportation Means: Pt reports access to transportation - boyfriend will pick pt up  Supports: No supports mentioned at this time  Reyes IvanChelsea Horton, LCSW 09/30/2013 10:03 AM

## 2013-09-30 NOTE — Progress Notes (Signed)
Patient ID: Savannah [name], female   DOB: [birth year], 28 y.o.   MRN: [id number] [facility] MD Progress Note  09/30/2013 6:29 PM Savannah [name]  MRN:  [id number] Subjective:  During today's assessment, pt is minimizing depression/anxiety symptoms and is very guarded. Pt is extremely oppositional, defiant, and nearly oblivious to outside advice or input from the treatment team. Pt continually stated: "if I'm right, I know I'm right, and I'll do whatever I can to make sure that other person knows they are wrong and I'm right, no matter what it takes". These statements were not directed at the team, but this type of oppositional tone overshadowed the entire assessment and it was quite evident that the pt was not receptive to treatment team and/or provider input at this time. Pt maintains that she was "possessed by demons" as evidenced by her sudden changes in behavior and attitude overall and that this occurred when she visited the house of her friend's deceased mother. Pt states she started having "4 or 5 panic attacks daily, since" being at the house. Pt denies SI, HI, and AVH, contracts for safety, yet still maintains that she may be possessed. Pt is very guarded and redundant in her statements about being right, being wrong, not needing to be at [facility], and being possessed. Pt is self-consoling with these redundant statements and they appear to be more for her own benefit through repetition than to convince the treatment team that they are true (that she is fine, not depressed, and does not need to be here).    Diagnosis:   DSM5: Trauma-Stressor Disorders:  Posttraumatic Stress Disorder (309.81) Substance/Addictive Disorders:  Alcohol Related Disorder - Moderate (303.90) and Cannabis Use Disorder - Moderate 9304.30) Depressive Disorders:  Major Depressive Disorder - with Psychotic Features (296.24) Total Time spent with patient: 30 minutes  Axis I: Alcohol Abuse, Generalized Anxiety Disorder, Major Depression,  Recurrent severe, Oppositional Defiant Disorder and Substance Abuse Axis II: Deferred Axis III:  Past Medical History  Diagnosis Date  . Depression    Axis IV: economic problems, other psychosocial or environmental problems, problems related to social environment, problems with access to health care services and problems with primary support group Axis V: 21-30 behavior considerably influenced by delusions or hallucinations OR serious impairment in judgment, communication OR inability to function in almost all areas  ADL's:  Intact  Sleep: Good  Appetite:  Good  Suicidal Ideation:  -Denies  Homicidal Ideation:  -Denies AEB (as evidenced by):  Psychiatric Specialty Exam: Physical Exam  Constitutional: She is oriented to person, place, and time. She appears well-developed and well-nourished.  HENT:  Head: Normocephalic and atraumatic.  Neck: Normal range of motion. Neck supple.  Respiratory: Effort normal.  Musculoskeletal: Normal range of motion.  Neurological: She is alert and oriented to person, place, and time.  Skin: Skin is warm and dry.    Review of Systems  Psychiatric/Behavioral: Positive for depression. Negative for suicidal ideas and hallucinations. The patient is nervous/anxious. The patient does not have insomnia.     Blood pressure 117/74, pulse 94, temperature 97.6 F (36.4 C), resp. rate 18, height 5\' 2"  (1.575 m), weight 90.039 kg (198 lb 8 oz), last menstrual period 09/27/2013, SpO2 99.00%.Body mass index is 36.3 kg/(m^2).  General Appearance: Casual  Eye Contact::  Fair  Speech:  Slow  Volume:  Increased  Mood:  Anxious and Depressed  Affect:  Blunt  Thought Process:  Negative and Tangential  Orientation:  Full (Time, Place, and Person)  Thought Content:  Negative  Suicidal Thoughts:  Nocontracts for safety  Homicidal Thoughts:  No  Memory:  Immediate;   Fair Recent;   Fair Remote;   Fair  Judgement:  Impaired  Insight:  Lacking  Psychomotor  Activity:  Decreased  Concentration:  Fair  Recall:  FiservFair  Fund of Knowledge:Fair  Language: Fair  Akathisia:  No  Handed:  Right  AIMS (if indicated):     Assets:  Desire for Improvement Physical Health Resilience  Sleep:  Number of Hours: 4.75   Musculoskeletal: Strength & Muscle Tone: within normal limits Gait & Station: normal Patient leans: N/A  Current Medications: Current Facility-Administered Medications  Medication Dose Route Frequency Provider Last Rate Last Dose  . acetaminophen (TYLENOL) tablet 650 mg  650 mg Oral Q4H PRN Thresa RossNadeem Akhtar, MD      . alum & mag hydroxide-simeth (MAALOX/MYLANTA) 200-200-20 MG/5ML suspension 30 mL  30 mL Oral PRN Thresa RossNadeem Akhtar, MD      . ARIPiprazole (ABILIFY) tablet 10 mg  10 mg Oral Daily Thresa RossNadeem Akhtar, MD   10 mg at 09/30/13 0753  . ibuprofen (ADVIL,MOTRIN) tablet 600 mg  600 mg Oral Q8H PRN Thresa RossNadeem Akhtar, MD      . LORazepam (ATIVAN) tablet 1 mg  1 mg Oral Q8H PRN Thresa RossNadeem Akhtar, MD      . magnesium hydroxide (MILK OF MAGNESIA) suspension 30 mL  30 mL Oral Daily PRN Thresa RossNadeem Akhtar, MD      . nicotine (NICODERM CQ - dosed in mg/24 hours) patch 21 mg  21 mg Transdermal Daily Thresa RossNadeem Akhtar, MD      . ondansetron (ZOFRAN) tablet 4 mg  4 mg Oral Q8H PRN Thresa RossNadeem Akhtar, MD      . zolpidem (AMBIEN) tablet 5 mg  5 mg Oral QHS PRN Thresa RossNadeem Akhtar, MD   5 mg at 09/29/13 2132    Lab Results: No results found for this or any previous visit (from the past 48 hour(s)).  Physical Findings: AIMS: Facial and Oral Movements Muscles of Facial Expression: None, normal Lips and Perioral Area: None, normal Jaw: None, normal Tongue: None, normal,Extremity Movements Upper (arms, wrists, hands, fingers): None, normal Lower (legs, knees, ankles, toes): None, normal, Trunk Movements Neck, shoulders, hips: None, normal, Overall Severity Severity of abnormal movements (highest score from questions above): None, normal Incapacitation due to abnormal movements:  None, normal Patient's awareness of abnormal movements (rate only patient's report): No Awareness, Dental Status Current problems with teeth and/or dentures?: No Does patient usually wear dentures?: No  CIWA:    COWS:     Treatment Plan Summary: Daily contact with patient to assess and evaluate symptoms and progress in treatment Medication management   Review of chart, vital signs, medications and notes   Plan: 1. Continue crisis management and stabilization. Estimated length of stay 5-7 days  2.  Medication management to reduce current symptoms to base line and improve patient's overall level of functioning     Medications reviewed with the pateint and side effect of sedation discussed, expect to improve with time    Individual and group therapy encouraged    Coping skills for depression, substance abuse, and anxiety 3.  Treat health problems as indicated. 4.  Develop treatment plan to decrease risk of relapse upon discharge and the need for readmission 5.  Psych-social education regarding relapse prevention and self care. 6.  Health care follow up as needed for medical problems 7.  Continue home medications where appropriate 8.  Disposition in progress  Plan:  Medical Decision Making Problem Points:  Established problem, stable/improving (1) and Review of psycho-social stressors (1) Data Points:  Review of medication regiment & side effects (2) Review of new medications or change in dosage (2)  I certify that inpatient services furnished can reasonably be expected to improve the patient's condition.   Beau Fanny, FNP-BC 09/30/2013, 6:29 PM  Reviewed the information documented and agree with the treatment plan.  Jovian Lembcke,JANARDHAHA R. 10/01/2013 2:54 PM

## 2013-09-30 NOTE — Tx Team (Signed)
Interdisciplinary Treatment Plan Update (Adult)  Date: 09/30/2013  Time Reviewed:  9:45 AM  Progress in Treatment: Attending groups: Yes Participating in groups:  Yes Taking medication as prescribed:  Yes Tolerating medication:  Yes Family/Significant othe contact made: CSW assessing  Patient understands diagnosis:  Yes Discussing patient identified problems/goals with staff:  Yes Medical problems stabilized or resolved:  Yes Denies suicidal/homicidal ideation: Yes Issues/concerns per patient self-inventory:  Yes Other:  New problem(s) identified: N/A  Discharge Plan or Barriers: CSW assessing for appropriate referrals.  Reason for Continuation of Hospitalization: Anxiety Depression Medication Stabilization  Comments: N/A  Estimated length of stay: 3-5 days  For review of initial/current patient goals, please see plan of care.  Attendees: Patient:     Family:     Physician:  Dr. Javier GlazierJohnalagadda 09/30/2013 10:32 AM   Nursing:   Quintella ReichertBeverly Knight, RN 09/30/2013 10:32 AM   Clinical Social Worker:  Reyes Ivanhelsea Horton, LCSW 09/30/2013 10:32 AM   Other: Claudette Headonrad Withrow, PA 09/30/2013 10:32 AM   Other:  Sherrye PayorValerie Noch, care coordination 09/30/2013 10:32 AM   Other:  Juline PatchQuylle Hodnett, LCSW 09/30/2013 10:32 AM   Other:  Neill Loftarol Davis, RN 09/30/2013 10:32 AM   Other: Dellia CloudAndrea Thorne, RN 09/30/2013 10:32 AM   Other:    Other:    Other:    Other:    Other:     Scribe for Treatment Team:   Carmina MillerHorton, Lakyla Biswas Nicole, 09/30/2013 10:32 AM

## 2013-09-30 NOTE — [facility] Group Notes (Signed)
[facility] LCSW Group Therapy  09/30/2013  1:15 PM   Type of Therapy:  Group Therapy  Participation Level:  Active  Participation Quality:  Attentive, Sharing and Supportive  Affect:  Drowsy, Calm  Cognitive:  Alert and Oriented  Insight:  Developing/Improving and Engaged  Engagement in Therapy:  Developing/Improving and Engaged  Modes of Intervention:  Clarification, Confrontation, Discussion, Education, Exploration, Limit-setting, Orientation, Problem-solving, Rapport Building, Dance movement psychotherapisteality Testing, Socialization and Support  Summary of Progress/Problems: Pt identified obstacles faced currently and processed barriers involved in overcoming these obstacles. Pt identified steps necessary for overcoming these obstacles and explored motivation (internal and external) for facing these difficulties head on. Pt further identified one area of concern in their lives and chose a goal to focus on for today.  Pt shared that her biggest obstacle is focusing on herself and making herself happy.  Pt explained that even as we sit in group right now, she is worried about a peer but needs to learn that she only has control over herself and can't try to fix things for others.  Pt was insightful in stating this and stated at the end of this group that this was very helpful for her and her recovery.  Pt actively participated and was engaged in group discussion.    Reyes IvanChelsea Horton, LCSW 09/30/2013 2:47 PM

## 2013-09-30 NOTE — Progress Notes (Signed)
D:  Per pt self inventory pt reports sleeping poor, appetite improving, energy level normal, ability to pay attention good, rates depression at a 3 out of 10 and hopelessness at a 2 out of 10, denies SI/HI/AVH.     A:  Emotional support provided, Encouraged pt to continue with treatment plan and attend all group activities, q15 min checks maintained for safety.  R:  Pt admits that she has an anger problem and occasionally needs to be verbally deescalated and redirected, had to be moved to a different room because her and her roommate were not able to get along, pt was hoping to leave today and was very upset at first when she found out she would have to stay, pt is ok with this now and is attending groups and interactive with other patients in the milieu.

## 2013-09-30 NOTE — Progress Notes (Signed)
Adult Psychoeducational Group Note  Date:  09/30/2013 Time:  10:51 PM  Group Topic/Focus:  Wrap-Up Group:   The focus of this group is to help patients review their daily goal of treatment and discuss progress on daily workbooks.  Participation Level:  Active  Participation Quality:  Appropriate  Affect:  Appropriate  Cognitive:  Appropriate  Insight: Appropriate  Engagement in Group:  Engaged  Modes of Intervention:  Support  Additional Comments:  Pt stated that she learned that she really need to take care of herself and that wellness has to do with so many categories and her particular wellness is unique to her. Pt was given support   Salena Ortlieb 09/30/2013, 10:51 PM

## 2013-10-01 NOTE — [facility] Group Notes (Signed)
[facility] LCSW Group Therapy  10/01/2013  1:15 PM   Type of Therapy:  Group Therapy  Participation Level:  Active  Participation Quality:  Appropriate, Attentive, Sharing and Supportive  Affect:  Calm  Cognitive:  Alert and Oriented  Insight:  Developing/Improving and Engaged  Engagement in Therapy:  Developing/Improving and Engaged  Modes of Intervention:  Activity, Clarification, Confrontation, Discussion, Education, Exploration, Limit-setting, Orientation, Problem-solving, Rapport Building, Dance movement psychotherapisteality Testing, Socialization and Support  Summary of Progress/Problems: Patient was attentive and engaged with speaker from [employer].  Patient was attentive to speaker while they shared their story of dealing with mental health and overcoming it.  Patient expressed interest in their programs and services and received information on their agency.  Patient processed ways they can relate to the speaker.   Pt was attentive to speaker until the end of group, when pt excused herself, stating that the meds were making her sleepy.    Savannah IvanChelsea Horton, LCSW 10/01/2013 1:53 PM

## 2013-10-01 NOTE — Progress Notes (Signed)
Patient ID: Savannah [name], female   DOB: 1985-12-16, 28 y.o.   MRN: [id number] [facility]  10/01/2013 2:03 PM Savannah [name]  MRN:  [id number] Subjective:  During prior assessment, pt is minimizing depression/anxiety symptoms and is very guarded. Pt is extremely oppositional, defiant, and nearly oblivious to outside advice or input from the treatment team. Pt continually stated: "if I'm right, I know I'm right, and I'll do whatever I can to make sure that other person knows they are wrong and I'm right, no matter what it takes". These statements were not directed at the team, but this type of oppositional tone overshadowed the entire assessment and it was quite evident that the pt was not receptive to treatment team and/or provider input at this time. Pt maintains that she was "possessed by demons" as evidenced by her sudden changes in behavior and attitude overall and that this occurred when she visited the house of her friend's deceased mother. Pt states she started having "4 or 5 panic attacks daily, since" being at the house. Pt denies SI, HI, and AVH, contracts for safety, yet still maintains that she may be possessed. Pt is very guarded and redundant in her statements about being right, being wrong, [facility], and being possessed. Pt is self-consoling with these redundant statements and they appear to be more for her own benefit through repetition than to convince the treatment team that they are true (that she is fine, not depressed, and does not need to be here).   During today's assessment, pt rates anxiety and depression both at 2/10. Pt is very positive and cheerful, apologizing for her behavior the other day where she was irate with providers and treatment team staff members. Pt indicates benefit from group interaction and states that this therapy as helped her to be less guarded, more open, and less insecure. Pt indicates agreement with current medication and treatment  plan as well as follow-up appointments. Pt denies SI, HI, and AVH, contracts for safety. Pt is anticipating discharge tomorrow.    Diagnosis:   DSM5: Trauma-Stressor Disorders:  Posttraumatic Stress Disorder (309.81) Substance/Addictive Disorders:  Alcohol Related Disorder - Moderate (303.90) and Cannabis Use Disorder - Moderate 9304.30) Depressive Disorders:  Major Depressive Disorder - with Psychotic Features (296.24) Total Time spent with patient: 30 minutes  Axis I: Alcohol Abuse, Generalized Anxiety Disorder, Major Depression, Recurrent severe, Oppositional Defiant Disorder and Substance Abuse Axis II: Deferred Axis III:  Past Medical History  Diagnosis Date  . Depression    Axis IV: economic problems, other psychosocial or environmental problems, problems related to social environment, problems with access to health care services and problems with primary support group Axis V: 21-30 behavior considerably influenced by delusions or hallucinations OR serious impairment in judgment, communication OR inability to function in almost all areas  ADL's:  Intact  Sleep: Good  Appetite:  Good  Suicidal Ideation:  -Denies  Homicidal Ideation:  -Denies AEB (as evidenced by):  Psychiatric Specialty Exam: Physical Exam  Constitutional: She is oriented to person, place, and time. She appears well-developed and well-nourished.  HENT:  Head: Normocephalic and atraumatic.  Neck: Normal range of motion. Neck supple.  Respiratory: Effort normal.  Musculoskeletal: Normal range of motion.  Neurological: She is alert and oriented to person, place, and time.  Skin: Skin is warm and dry.    Review of Systems  Constitutional: Negative.   HENT: Negative.   Eyes: Negative.   Respiratory: Negative.  Cardiovascular: Negative.   Gastrointestinal: Negative.   Genitourinary: Negative.   Musculoskeletal: Negative.   Skin: Negative.   Neurological: Negative.   Endo/Heme/Allergies: Negative.    Psychiatric/Behavioral: Positive for depression. Negative for suicidal ideas and hallucinations. The patient is nervous/anxious. The patient does not have insomnia.     Blood pressure 114/68, pulse 82, temperature 97.4 F (36.3 C), temperature source Oral, resp. rate 18, height 5\' 2"  (1.575 m), weight 90.039 kg (198 lb 8 oz), last menstrual period 09/27/2013, SpO2 99.00%.Body mass index is 36.3 kg/(m^2).  General Appearance: Casual  Eye Contact::  Fair  Speech:  Slow  Volume:  Increased  Mood:  Euthymic  Affect:  Appropriate  Thought Process:  Coherent  Orientation:  Full (Time, Place, and Person)  Thought Content:  WDL  Suicidal Thoughts:  Nocontracts for safety  Homicidal Thoughts:  No  Memory:  Immediate;   Fair Recent;   Fair Remote;   Fair  Judgement:  Impaired  Insight:  Lacking  Psychomotor Activity:  Normal  Concentration:  Fair  Recall:  Good  Fund of Knowledge:Fair  Language: Fair  Akathisia:  No  Handed:  Right  AIMS (if indicated):     Assets:  Desire for Improvement Physical Health Resilience  Sleep:  Number of Hours: 6   Musculoskeletal: Strength & Muscle Tone: within normal limits Gait & Station: normal Patient leans: N/A  Current Medications: Current Facility-Administered Medications  Medication Dose Route Frequency Provider Last Rate Last Dose  . acetaminophen (TYLENOL) tablet 650 mg  650 mg Oral Q4H PRN Thresa Ross, MD      . alum & mag hydroxide-simeth (MAALOX/MYLANTA) 200-200-20 MG/5ML suspension 30 mL  30 mL Oral PRN Thresa Ross, MD      . ARIPiprazole (ABILIFY) tablet 10 mg  10 mg Oral Daily Thresa Ross, MD   10 mg at 10/01/13 0803  . ibuprofen (ADVIL,MOTRIN) tablet 600 mg  600 mg Oral Q8H PRN Thresa Ross, MD      . LORazepam (ATIVAN) tablet 1 mg  1 mg Oral Q8H PRN Thresa Ross, MD      . magnesium hydroxide (MILK OF MAGNESIA) suspension 30 mL  30 mL Oral Daily PRN Thresa Ross, MD      . nicotine (NICODERM CQ - dosed in mg/24 hours)  patch 21 mg  21 mg Transdermal Daily Thresa Ross, MD      . ondansetron (ZOFRAN) tablet 4 mg  4 mg Oral Q8H PRN Thresa Ross, MD      . zolpidem (AMBIEN) tablet 5 mg  5 mg Oral QHS PRN Thresa Ross, MD   5 mg at 09/30/13 2254    Lab Results: No results found for this or any previous visit (from the past 48 hour(s)).  Physical Findings: AIMS: Facial and Oral Movements Muscles of Facial Expression: None, normal Lips and Perioral Area: None, normal Jaw: None, normal Tongue: None, normal,Extremity Movements Upper (arms, wrists, hands, fingers): None, normal Lower (legs, knees, ankles, toes): None, normal, Trunk Movements Neck, shoulders, hips: None, normal, Overall Severity Severity of abnormal movements (highest score from questions above): None, normal Incapacitation due to abnormal movements: None, normal Patient's awareness of abnormal movements (rate only patient's report): No Awareness, Dental Status Current problems with teeth and/or dentures?: No Does patient usually wear dentures?: No  CIWA:    COWS:     Treatment Plan Summary: Daily contact with patient to assess and evaluate symptoms and progress in treatment Medication management   Review of chart, vital signs,  medications and notes   Plan: 1. Continue crisis management and stabilization. Estimated length of stay 5-7 days  2.  Medication management to reduce current symptoms to base line and improve patient's overall level of functioning     Medications reviewed with the pateint and side effect of sedation discussed, expect to improve with time    Individual and group therapy encouraged    Coping skills for depression, substance abuse, and anxiety 3.  Treat health problems as indicated. 4.  Develop treatment plan to decrease risk of relapse upon discharge and the need for readmission 5.  Psych-social education regarding relapse prevention and self care. 6.  Health care follow up as needed for medical problems 7.   Continue home medications where appropriate 8.  Disposition in progress  Plan:  Medical Decision Making Problem Points:  Established problem, stable/improving (1) and Review of psycho-social stressors (1) Data Points:  Review of medication regiment & side effects (2) Review of new medications or change in dosage (2)  I certify that inpatient services furnished can reasonably be expected to improve the patient's condition.   Beau Fanny, FNP-BC 10/01/2013, 2:03 PM  Reviewed the information documented and agree with the treatment plan.  Katana Berthold,JANARDHAHA R. 10/02/2013 12:12 PM

## 2013-10-01 NOTE — Progress Notes (Signed)
D: Pt denies SI/HI/AVH. Pt is pleasant and cooperative. Pt stated her ex "Lincon Sahlin" who thinks they are still together triggered her behavior the other day. Pt stated the situation with the roommate was upsetting  Because the roommate Elayne Snare(Quinasia) was planning to kill her baby.Akeema told her to be thankful for the miracle, but they got into shouting matches  A: Pt was offered support and encouragement. Pt was given scheduled medications. Pt was encourage to attend groups. Q 15 minute checks were done for safety.  R:Pt attends groups and interacts well with peers and staff. Pt is taking medication. Pt has no complaints.Pt receptive to treatment and safety maintained on unit.

## 2013-10-01 NOTE — Progress Notes (Signed)
Recreation Therapy Notes  Date: 02.23.2015 Time: 2:45pm Location: [address] Dayroom   Group Topic: Wellness  Goal Area(s) Addresses:  Patient will define components of whole wellness. Patient will verbalize benefit of whole wellness.  Behavioral Response: Did not attend.   Marykay Lexenise L Gleb Mcguire, LRT/CTRS  Jearl KlinefelterBlanchfield, Remy Dia L 10/01/2013 9:31 AM

## 2013-10-01 NOTE — [facility] Suicide Risk Assessment (Signed)
[facility] INPATIENT:  Family/Significant Other Suicide Prevention Education  Suicide Prevention Education:  Education Completed; Savannah BollmanMichael [name] - boyfriend [phone]),  (name of family member/significant other) has been identified by the patient as the family member/significant other with whom the patient will be residing, and identified as the person(s) who will aid the patient in the event of a mental health crisis (suicidal ideations/suicide attempt).  With written consent from the patient, the family member/significant other has been provided the following suicide prevention education, prior to the and/or following the discharge of the patient.  The suicide prevention education provided includes the following:  Suicide risk factors  Suicide prevention and interventions  National Suicide Hotline telephone number  [facility] assessment telephone number  [facility] Emergency Assistance 911  [facility] and/or Residential Mobile Crisis Unit telephone number  Request made of family/significant other to:  Remove weapons (e.g., guns, rifles, knives), all items previously/currently identified as safety concern.    Remove drugs/medications (over-the-counter, prescriptions, illicit drugs), all items previously/currently identified as a safety concern.  The family member/significant other verbalizes understanding of the suicide prevention education information provided.  The family member/significant other agrees to remove the items of safety concern listed above. Boyfriend states that pt has not been doing well for awhile, with mood swings, anger issues and SI.  Boyfriend states that pt has been violent in the past and worries about pt as she has limited support.   Savannah [name], Savannah [name] Savannah [name] 10/01/2013, 9:16 AM

## 2013-10-01 NOTE — Progress Notes (Signed)
The focus of this group is to educate the patient on the purpose and policies of crisis stabilization and provide a format to answer questions about their admission.  The group details unit policies and expectations of patients while admitted.  Patient attended 0900 nurse education orientation group this morning.  Patient actively participated, appropriate affect, alert, appropriate insight and engagement.  Today patient will work on 3 goals for discharge.  

## 2013-10-01 NOTE — Clinical Social Work Note (Signed)
CSW spoke with pt's sister Tonita CongSavonya Tucker ([phone]) at this time.  Ms. Pricilla Holmucker states that she has been in contact with pt since she's been in the hospital and feels she is safe to d/c tomorrow.  Ms. Pricilla Holmucker had no further questions or concerns in regards to pt.    Reyes IvanChelsea Horton, LCSW 10/01/2013  2:59 PM

## 2013-10-01 NOTE — Progress Notes (Signed)
D:  Patient's self inventory sheet, patient sleeps well, poor appetite, normal energy level, good attention span.  Rated depression and hopeless 2.  Denied withdrawals.  Denied SI.  Denied physical problems, zero pain goal.  "I would like to go back to school for healthcare.  I really like this and go to all my outside therapy.  I am very thankful for every lesson in here, I know I can be a little bit to much  I can't help the fact I'm over excited."    Does have discharge plans.  No problems taking meds after discharge. A:  Medications administered per MD orders.  Emotional support and encouragement given patient. R:  Denied SI and HI.  Denied A/V hallucinations.  Denied pain.  Will continue to monitor patient for safety with 15 minute checks.  Safety maintained.

## 2013-10-02 MED ORDER — ARIPIPRAZOLE 10 MG PO TABS: 10.0000 mg | ORAL_TABLET | Freq: Every day | ORAL | Status: DC

## 2013-10-02 MED ORDER — ZOLPIDEM TARTRATE 5 MG PO TABS: 2.5000 mg | ORAL_TABLET | Freq: Every evening | ORAL | Status: DC | PRN

## 2013-10-02 NOTE — Progress Notes (Signed)
Discharge Note: Discharge instructions and prescriptions given to patient. Patient verbalized understanding of discharge instructions and prescriptions. Returned belongings to patient. Denies SI/HI/AVH. Patient d/c without incident to the lobby and transported home by her uncle.

## 2013-10-02 NOTE — Progress Notes (Signed)
Recreation Therapy Notes  Date: 02.24.2015 Time: 2:45pm Location: [address] Dayroom   Group Topic: Communication, Team Building, Problem Solving  Goal Area(s) Addresses:  Patient will effectively work with peer towards shared goal.  Patient will identify skill used to make activity successful.  Patient will identify how skills used during activity can be used to reach post d/c goals.   Behavioral Response: Appropriate   Intervention: Problem Solving Activitiy  Activity: Life Boat. Patients were given a scenario about being on a sinking yacht. Patients were informed the yacht included 15 guest, 8 of which could be placed on the life boat, along with all group members. Individuals on guest list were of varying socioeconomic classes such as a Education officer, museumriest, Materials engineerresident Obama, MidwifeBus Driver, Tree surgeonTeacher and Chef.   Education: Pharmacist, communityocial Skills, Discharge Planning   Education Outcome: Acknowledges understanding  Clinical Observations/Feedback: Patient actively engaged in group activity, voicing her opinion and debating with peers appropriately. Patient contributed to group discussion, relating group skills to having stability in her life.    Marykay Lexenise L Lakiya Cottam, LRT/CTRS  Charleen Madera L 10/02/2013 1:19 PM

## 2013-10-02 NOTE — [facility] Suicide Risk Assessment (Signed)
   Demographic Factors:  Adolescent or young adult, Caucasian, Low socioeconomic status and Unemployed  Total Time spent with patient: 30 minutes  Psychiatric Specialty Exam: Physical Exam  Constitutional: She is oriented to person, place, and time. She appears well-developed.  HENT:  Head: Normocephalic.  Eyes: Pupils are equal, round, and reactive to light.  Neck: Normal range of motion.  Cardiovascular: Normal rate.   GI: Soft.  Musculoskeletal: Normal range of motion.  Neurological: She is alert and oriented to person, place, and time.  Skin: Skin is warm.  Psychiatric: She has a normal mood and affect. Her behavior is normal. Judgment and thought content normal.    Review of Systems  All other systems reviewed and are negative.    Blood pressure 114/69, pulse 84, temperature 97.6 F (36.4 C), temperature source Oral, resp. rate 20, height 5\' 2"  (1.575 m), weight 90.039 kg (198 lb 8 oz), last menstrual period 09/27/2013, SpO2 99.00%.Body mass index is 36.3 kg/(m^2).  General Appearance: Casual  Eye Contact::  Good  Speech:  Clear and Coherent  Volume:  Normal  Mood:  Anxious and Depressed  Affect:  Depressed and Flat  Thought Process:  Goal Directed and Intact  Orientation:  Full (Time, Place, and Person)  Thought Content:  WDL  Suicidal Thoughts:  No  Homicidal Thoughts:  No  Memory:  Immediate;   Fair  Judgement:  Intact  Insight:  Fair  Psychomotor Activity:  Normal  Concentration:  Fair  Recall:  FiservFair  Fund of Knowledge:Good  Language: Good  Akathisia:  NA  Handed:  Right  AIMS (if indicated):     Assets:  Communication Skills Desire for Improvement Housing Intimacy Leisure Time Physical Health Resilience Social Support Talents/Skills Transportation  Sleep:  Number of Hours: 5.25    Musculoskeletal: Strength & Muscle Tone: within normal limits Gait & Station: normal Patient leans: N/A   Mental Status Per Nursing Assessment::   On Admission:      Current Mental Status by Physician: NA  Loss Factors: Financial problems/change in socioeconomic status  Historical Factors: Impulsivity  Risk Reduction Factors:   Sense of responsibility to family, Religious beliefs about death, Living with another person, especially a relative, Positive social support, Positive therapeutic relationship and Positive coping skills or problem solving skills  Continued Clinical Symptoms:  Depression:   Recent sense of peace/wellbeing Previous Psychiatric Diagnoses and Treatments  Cognitive Features That Contribute To Risk:  Polarized thinking    Suicide Risk:  Minimal: No identifiable suicidal ideation.  Patients presenting with no risk factors but with morbid ruminations; may be classified as minimal risk based on the severity of the depressive symptoms  Discharge Diagnoses:   AXIS I:  Major Depression, single episode AXIS II:  Deferred AXIS III:   Past Medical History  Diagnosis Date  . Depression    AXIS IV:  other psychosocial or environmental problems, problems related to social environment and problems with primary support group AXIS V:  61-70 mild symptoms  Plan Of Care/Follow-up recommendations:  Activity:  as tolerated Diet:  regular  Is patient on multiple antipsychotic therapies at discharge:  No   Has Patient had three or more failed trials of antipsychotic monotherapy by history:  No  Recommended Plan for Multiple Antipsychotic Therapies: NA    Trayon Krantz,JANARDHAHA R. 10/02/2013, 12:59 PM

## 2013-10-02 NOTE — Tx Team (Signed)
Interdisciplinary Treatment Plan Update (Adult)  Date: 10/02/2013  Time Reviewed:  9:45 AM  Progress in Treatment: Attending groups: Yes Participating in groups:  Yes Taking medication as prescribed:  Yes Tolerating medication:  Yes Family/Significant othe contact made: Yes, with pt's boyfriend and sister Patient understands diagnosis:  Yes Discussing patient identified problems/goals with staff:  Yes Medical problems stabilized or resolved:  Yes Denies suicidal/homicidal ideation: Yes Issues/concerns per patient self-inventory:  Yes Other:  New problem(s) identified: N/A  Discharge Plan or Barriers: [facility]  Reason for Continuation of Hospitalization: Stable to d/c today  Comments: N/A  Estimated length of stay: D/C today  For review of initial/current patient goals, please see plan of care.  Attendees: Patient:  Syble CreekBritney L Levesque  10/02/2013 10:27 AM   Family:     Physician:  Dr. Javier GlazierJohnalagadda 10/02/2013 10:27 AM   Nursing:   Harold Barbanonecia Byrd, RN 10/02/2013 10:27 AM   Clinical Social Worker:  Reyes Ivanhelsea Horton, LCSW 10/02/2013 10:27 AM   Other: Claudette Headonrad Withrow, PA 10/02/2013 10:27 AM   Other:  Sherrye PayorValerie Noch, care coordination 10/02/2013 10:27 AM   Other:  Juline PatchQuylle Hodnett, LCSW 10/02/2013 10:27 AM   Other:  Marzetta Boardhrista Dopson, RN 10/02/2013 10:27 AM   Other:    Other:    Other:    Other:    Other:      Scribe for Treatment Team:   Carmina MillerHorton, Jakaylee Sasaki Nicole, 10/02/2013 , 10:27 AM

## 2013-10-02 NOTE — [facility] Group Notes (Signed)
[facility] LCS[facility] Aftercare Discharge Planning Group Note   10/02/2013 8:45 AM  Participation Quality:  Alert, Appropriate and Oriented  Mood/Affect:  Calm  Depression Rating: 0  Anxiety Rating:  0  Thoughts of Suicide:  Pt denies SI/HI  Will you contract for safety?   Yes  Current AVH:  Pt denies  Plan for Discharge/Comments:  Pt attended discharge planning group and actively participated in group.  CSW provided pt with today's workbook.  Pt reports feeling well today and ready to d/c.  [place] and is open to [facility] management and therapy.  Pt requested a letter to return to work tomorrow.  No further needs voiced by pt at this time.    Transportation Means: Pt reports access to transportation - boyfriend will pick pt up  Supports: No supports mentioned at this time  Reyes IvanChelsea Horton, LCSW 10/02/2013 10:21 AM

## 2013-10-02 NOTE — Progress Notes (Signed)
Adult Psychoeducational Group Note  Date:  10/02/2013 Time:  10:00am Group Topic/Focus:  Personal Choices and Values:   The focus of this group is to help patients assess and explore the importance of values in their lives, how their values affect their decisions, how they express their values and what opposes their expression.  Participation Level:  Active  Participation Quality:  Appropriate and Attentive  Affect:  Appropriate  Cognitive:  Alert and Appropriate  Insight: Appropriate  Engagement in Group:  Engaged  Modes of Intervention:  Discussion and Education  Additional Comments:  Pt attended and participated in group. Discussion was on personal development. Pt was asked what does personal development mean to you? Pt stated personal development means having a stable mind and knowing right from wrong.  Shelly BombardGarner, Delcia Spitzley D 10/02/2013, 2:27 PM

## 2013-10-02 NOTE — Progress Notes (Signed)
[facility] Group Notes:  (Nursing/MHT/Case Management/Adjunct)  Date:  10/02/2013  Time:  12:59 AM  Type of Therapy:  Group Therapy  Participation Level:  Active  Participation Quality:  Appropriate, Monopolizing and Redirectable  Affect:  Appropriate and Blunted  Cognitive:  Appropriate  Insight:  Good  Engagement in Group:  Engaged  Modes of Intervention:  Socialization and Support  Summary of Progress/Problems: Pt. Had good insight in group topic of recovery.  Pt. Was appropriate in group, but became irritable was other patient in group.  Pt. Was intrusive, but was redirectable.  Savannah [name], Savannah [name] 10/02/2013, 12:59 AM

## 2013-10-02 NOTE — Progress Notes (Signed)
[facility] :  Will you be returning to the same living situation after discharge: Yes,  pt plans to stay with her sister At discharge, do you have transportation home?:Yes,  sister will pick pt up Do you have the ability to pay for your medications:Yes,  provided pt with samples and prescriptions and [facility] with affording meds  Release of information consent forms completed and in the chart;  Patient's signature needed at discharge.  Patient to Follow up at: Follow-up Information   Follow up with [facility] On 10/04/2013. (Walk in on this date for hospital discharge appointment.  Walk in clinic is Monday - Friday 8 am - 3 pm.  They will than schedule you for medication management and therapy)    Contact information:   [address] , [place] [address] Phone: [phone] Fax: [phone]      Patient denies SI/HI:   Yes,  denies SI/HI    Safety Planning and Suicide Prevention discussed:  Yes,  discussed with pt and pt's boyfriend.  See suicide prevention education note.   Carmina MillerHorton, Otilia Kareem Nicole 10/02/2013, 10:32 AM

## 2013-10-02 NOTE — Progress Notes (Signed)
D: Pt denies SI/HI/AVH. Pt is pleasant and cooperative. Pt stated she was  happy to be going home Wed. Pt really wants to finish school and wants to go back to work. Pt says she is going to have to work on not helping people as much and focus on helping self.    A: Pt was offered support and encouragement. Pt was given scheduled medications. Pt was encourage to attend groups. Q 15 minute checks were done for safety.   R:Pt attends groups and interacts well with peers and staff. Pt is taking medication. Pt has no complaints at this time.Pt receptive to treatment and safety maintained on unit.

## 2013-10-04 ENCOUNTER — Inpatient Hospital Stay (HOSPITAL_COMMUNITY)
Admission: AD | Admit: 2013-10-04 | Discharge: 2013-10-04 | Discharge status: Against Medical Advice | Payer: Self-pay | Source: Ambulatory Visit | Attending: Obstetrics & Gynecology | Admitting: Obstetrics & Gynecology

## 2013-10-04 NOTE — MAU Note (Signed)
Patient was brought to triage but does not want to stay. States she will return later, she has things to do.

## 2013-10-04 NOTE — Progress Notes (Signed)
Patient Discharge Instructions:  After Visit Summary (AVS):   Faxed to:  10/04/13 Psychiatric Admission Assessment Note:   Faxed to:  10/04/13 Suicide Risk Assessment - Discharge Assessment:   Faxed to:  10/04/13 Faxed/Sent to the Next Level Care provider:  10/04/13 Faxed to [facility] @ [phone]  Jerelene ReddenSheena E Twiggs, 10/04/2013, 4:12 PM

## 2013-10-05 ENCOUNTER — Encounter (HOSPITAL_COMMUNITY): Payer: Self-pay | Admitting: *Deleted

## 2013-10-05 ENCOUNTER — Inpatient Hospital Stay (HOSPITAL_COMMUNITY)
Admission: AD | Admit: 2013-10-05 | Discharge: 2013-10-05 | Disposition: A | Discharge status: Home / Self Care | Payer: Self-pay | Source: Ambulatory Visit | Attending: Obstetrics & Gynecology | Admitting: Obstetrics & Gynecology

## 2013-10-05 DIAGNOSIS — N390 Urinary tract infection, site not specified: Secondary | ICD-10-CM | POA: Insufficient documentation

## 2013-10-05 DIAGNOSIS — K625 Hemorrhage of anus and rectum: Secondary | ICD-10-CM | POA: Insufficient documentation

## 2013-10-05 DIAGNOSIS — R197 Diarrhea, unspecified: Secondary | ICD-10-CM | POA: Insufficient documentation

## 2013-10-05 DIAGNOSIS — F3289 Other specified depressive episodes: Secondary | ICD-10-CM | POA: Insufficient documentation

## 2013-10-05 DIAGNOSIS — M549 Dorsalgia, unspecified: Secondary | ICD-10-CM | POA: Insufficient documentation

## 2013-10-05 DIAGNOSIS — F329 Major depressive disorder, single episode, unspecified: Secondary | ICD-10-CM | POA: Insufficient documentation

## 2013-10-05 DIAGNOSIS — R109 Unspecified abdominal pain: Secondary | ICD-10-CM | POA: Insufficient documentation

## 2013-10-05 DIAGNOSIS — F17200 Nicotine dependence, unspecified, uncomplicated: Secondary | ICD-10-CM | POA: Insufficient documentation

## 2013-10-05 HISTORY — DX: Renal tubulo-interstitial disease, unspecified: N15.9

## 2013-10-05 LAB — URINALYSIS, ROUTINE W REFLEX MICROSCOPIC
Bilirubin Urine: NEGATIVE
Glucose, UA: NEGATIVE mg/dL
Ketones, ur: NEGATIVE mg/dL
Nitrite: NEGATIVE
Protein, ur: NEGATIVE mg/dL
SPECIFIC GRAVITY, URINE: 1.025 (ref 1.005–1.030)
UROBILINOGEN UA: 0.2 mg/dL (ref 0.0–1.0)
pH: 6 (ref 5.0–8.0)

## 2013-10-05 LAB — CBC
HCT: 41.1 % (ref 36.0–46.0)
HEMOGLOBIN: 13.1 g/dL (ref 12.0–15.0)
MCH: 26.6 pg (ref 26.0–34.0)
MCHC: 31.9 g/dL (ref 30.0–36.0)
MCV: 83.5 fL (ref 78.0–100.0)
Platelets: 209 10*3/uL (ref 150–400)
RBC: 4.92 MIL/uL (ref 3.87–5.11)
RDW: 16.1 % — AB (ref 11.5–15.5)
WBC: 6.1 10*3/uL (ref 4.0–10.5)

## 2013-10-05 LAB — URINE MICROSCOPIC-ADD ON

## 2013-10-05 LAB — POCT PREGNANCY, URINE: Preg Test, Ur: NEGATIVE

## 2013-10-05 MED ORDER — CIPROFLOXACIN HCL 250 MG PO TABS: 250.0000 mg | ORAL_TABLET | Freq: Two times a day (BID) | ORAL | Status: DC

## 2013-10-05 NOTE — Discharge Instructions (Signed)
Urinary Tract Infection °A urinary tract infection (UTI) can occur any place along the urinary tract. The tract includes the kidneys, ureters, bladder, and urethra. A type of germ called bacteria often causes a UTI. UTIs are often helped with antibiotic medicine.  °HOME CARE  °· If given, take antibiotics as told by your doctor. Finish them even if you start to feel better. °· Drink enough fluids to keep your pee (urine) clear or pale yellow. °· Avoid tea, drinks with caffeine, and bubbly (carbonated) drinks. °· Pee often. Avoid holding your pee in for a long time. °· Pee before and after having sex (intercourse). °· Wipe from front to back after you poop (bowel movement) if you are a woman. Use each tissue only once. °GET HELP RIGHT AWAY IF:  °· You have back pain. °· You have lower belly (abdominal) pain. °· You have chills. °· You feel sick to your stomach (nauseous). °· You throw up (vomit). °· Your burning or discomfort with peeing does not go away. °· You have a fever. °· Your symptoms are not better in 3 days. °MAKE SURE YOU:  °· Understand these instructions. °· Will watch your condition. °· Will get help right away if you are not doing well or get worse. °Document Released: 01/11/2008 Document Revised: 04/18/2012 Document Reviewed: 02/23/2012 °ExitCare® Patient Information ©2014 [employer]. ° °

## 2013-10-05 NOTE — MAU Provider Note (Signed)
History     CSN: [id number]  Arrival date and time: 10/05/13 [id number]   First Provider Initiated Contact with Patient 10/05/13 0645      Chief Complaint  Patient presents with  . Rectal Bleeding  . Back Pain   HPI [name] is a 28 y.o. female who presents to MAU today with complaint of back pain, abdominal pain and rectal bleeding x 3 weeks. She states that the symptoms have been worse recently. She states abdominal pain is in the LUQ, LLQ and RLQ. She endorses diarrhea with last BM at 0400 today. She is unsure about hemorrhoids but does endorse pain with BM. She denies N/V or constipation or fever. The [facility]. She denies SI or HI today.   OB History   Grav Para Term Preterm Abortions TAB SAB Ect Mult Living                  Past Medical History  Diagnosis Date  . Depression   . Kidney infection     History reviewed. No pertinent past surgical history.  Family History  Problem Relation Age of Onset  . Diabetes Other   . Hypertension Other   . Stroke Other     History  Substance Use Topics  . Smoking status: Current Every Day Smoker -- 0.30 packs/day    Types: Cigarettes  . Smokeless tobacco: Not on file  . Alcohol Use: Yes     Comment: Occasional Drinker    Allergies:  Allergies  Allergen Reactions  . Sudafed [Pseudoephedrine] Swelling    Swelling     Prescriptions prior to admission  Medication Sig Dispense Refill  . ARIPiprazole (ABILIFY) 10 MG tablet Take 1 tablet (10 mg total) by mouth daily.  30 tablet  0  . zolpidem (AMBIEN) 5 MG tablet Take 0.5-1 tablets (2.5-5 mg total) by mouth at bedtime as needed for sleep (Take 1/2 tab before bedtime as needed; If more is needed, take 2nd half tab.).  14 tablet  0    Review of Systems  Constitutional: Negative for fever and malaise/fatigue.  Gastrointestinal: Positive for abdominal pain and diarrhea. Negative for nausea, vomiting and  constipation.  Genitourinary: Negative for dysuria, urgency and frequency.       Neg - vaginal bleeding, discharge   Physical Exam   Blood pressure 101/61, pulse 78, temperature 97.5 F (36.4 C), resp. rate 16, height 5\' 2"  (1.575 m), weight 203 lb (92.08 kg), last menstrual period 09/27/2013, SpO2 100.00%.  Physical Exam  Constitutional: She is oriented to person, place, and time. She appears well-developed and well-nourished. No distress.  HENT:  Head: Normocephalic and atraumatic.  Cardiovascular: Normal rate.   Respiratory: Effort normal.  GI: Soft. Bowel sounds are normal. She exhibits no distension and no mass. There is tenderness (mild diffuse tenderness to palpation with the exception of the RUQ). There is no rebound, no guarding and no CVA tenderness.  Genitourinary: Rectal exam shows external hemorrhoid (very small, no bleeding). Rectal exam shows no fissure.  Musculoskeletal:       Cervical back: She exhibits normal range of motion, no tenderness, no bony tenderness, no swelling, no edema, no deformity and no pain.       Thoracic back: She exhibits normal range of motion, no tenderness, no bony tenderness, no swelling, no edema, no deformity and no pain.       Lumbar back: She exhibits normal range of motion, no  tenderness, no bony tenderness, no swelling, no edema, no deformity and no pain.  Neurological: She is alert and oriented to person, place, and time.  Skin: Skin is warm and dry. No erythema.  Psychiatric: She has a normal mood and affect.   Results for orders placed during the hospital encounter of 10/05/13 (from the past 24 hour(s))  URINALYSIS, ROUTINE W REFLEX MICROSCOPIC     Status: Abnormal   Collection Time    10/05/13  5:55 AM      Result Value Ref Range   Color, Urine YELLOW  YELLOW   APPearance HAZY (*) CLEAR   Specific Gravity, Urine 1.025  1.005 - 1.030   pH 6.0  5.0 - 8.0   Glucose, UA NEGATIVE  NEGATIVE mg/dL   Hgb urine dipstick SMALL (*) NEGATIVE    Bilirubin Urine NEGATIVE  NEGATIVE   Ketones, ur NEGATIVE  NEGATIVE mg/dL   Protein, ur NEGATIVE  NEGATIVE mg/dL   Urobilinogen, UA 0.2  0.0 - 1.0 mg/dL   Nitrite NEGATIVE  NEGATIVE   Leukocytes, UA MODERATE (*) NEGATIVE  URINE MICROSCOPIC-ADD ON     Status: Abnormal   Collection Time    10/05/13  5:55 AM      Result Value Ref Range   Squamous Epithelial / LPF RARE  RARE   WBC, UA TOO NUMEROUS TO COUNT  <3 WBC/hpf   RBC / HPF 0-2  <3 RBC/hpf   Bacteria, UA MANY (*) RARE   Urine-Other MUCOUS PRESENT    POCT PREGNANCY, URINE     Status: None   Collection Time    10/05/13  6:07 AM      Result Value Ref Range   Preg Test, Ur NEGATIVE  NEGATIVE  CBC     Status: Abnormal   Collection Time    10/05/13  7:19 AM      Result Value Ref Range   WBC 6.1  4.0 - 10.5 K/uL   RBC 4.92  3.87 - 5.11 MIL/uL   Hemoglobin 13.1  12.0 - 15.0 g/dL   HCT 16.1  09.6 - 04.5 %   MCV 83.5  78.0 - 100.0 fL   MCH 26.6  26.0 - 34.0 pg   MCHC 31.9  30.0 - 36.0 g/dL   RDW 40.9 (*) 81.1 - 91.4 %   Platelets 209  150 - 400 K/uL    MAU Course  Procedures None  MDM UPT - negative UA and CBC today  Assessment and Plan  A: Rectal bleeding Diarrhea UTI  Chronic back pain  P: Discharge home Rx for Cipro sent to patient's pharmacy Bleeding precautions discussed Patient advised to start care with PCP of choice and any referral for back pain required can be discussed with PCP Patient to follow-up with psych as scheduled Patient may return to MAU as needed or if her condition were to change or worsen, patient advised that worsening GI symptoms would be better evaluated at [facility], [facility]  10/05/2013, 7:39 AM

## 2013-10-05 NOTE — Discharge Summary (Signed)
Physician Discharge Summary Note  Patient:  Savannah CreekBritney L Kittler is an 28 y.o., female MRN:  [id number] DOB:  Nov 22, 1985 Patient phone:  [phone] (home)  Patient address:   [address] [place] [id number],  Total Time spent with patient: Greater than 30 minutes  Date of Admission:  09/27/2013 Date of Discharge: 10/03/2013  Reason for Admission:  MDD with SI  Discharge Diagnoses: Active Problems:   Unspecified episodic mood disorder   Psychosis   Psychiatric Specialty Exam: Physical Exam  Review of Systems  Constitutional: Negative.   HENT: Negative.   Eyes: Negative.   Respiratory: Negative.   Cardiovascular: Negative.   Gastrointestinal: Negative.   Genitourinary: Negative.   Musculoskeletal: Negative.   Skin: Negative.   Neurological: Negative.   Endo/Heme/Allergies: Negative.   Psychiatric/Behavioral: Positive for depression. The patient is nervous/anxious.     Blood pressure 114/69, pulse 84, temperature 97.6 F (36.4 C), temperature source Oral, resp. rate 20, height 5\' 2"  (1.575 m), weight 90.039 kg (198 lb 8 oz), last menstrual period 09/27/2013, SpO2 99.00%.Body mass index is 36.3 kg/(m^2).  General Appearance: Casual  Eye Contact::  Good  Speech:  Clear and Coherent  Volume:  Normal  Mood:  Anxious and Depressed  Affect:  Appropriate  Thought Process:  Coherent  Orientation:  Full (Time, Place, and Person)  Thought Content:  WDL  Suicidal Thoughts:  No  Homicidal Thoughts:  No  Memory:  Immediate;   Good Recent;   Good Remote;   Good  Judgement:  Good  Insight:  Fair  Psychomotor Activity:  Normal  Concentration:  Good  Recall:  Good  Fund of Knowledge:Good  Language: Good  Akathisia:  NA  Handed:  Right  AIMS (if indicated):     Assets:  Communication Skills Desire for Improvement Resilience  Sleep:  Number of Hours: 5.25    Musculoskeletal: Strength & Muscle Tone: within normal limits Gait & Station: normal Patient leans:  N/A  DSM5:   Depressive Disorders:  Major Depressive Disorder - Severe (296.23)  Axis Diagnosis:   AXIS I:  Major Depression, Recurrent severe AXIS II:  Deferred AXIS III:   Past Medical History  Diagnosis Date  . Depression   . Kidney infection    AXIS IV:  other psychosocial or environmental problems and problems related to social environment AXIS V:  61-70 mild symptoms  Level of Care:  OP  Hospital Course:   28 year old African-American female. [facility] She reports, "My ex-boyfriend took me to the hospital 2 days ago. I need help getting some demons out of my life. I'm also going through some life crisis with my ex-girlfriend. About 2 weeks ago, I sat on the grave of the mother of my ex-girlfriend. I was helping friend get over her grief. I feel possessed since. The demon tries to hold me down. It robs my back. I get anxiety attacks that makes me wanna destroy myself. I have thoughts of suicide. I attempted suicide 2 weeks ago, I ran my head to the wall.   During Hospitalization: Medications managed, psychoeducation, group and individual therapy. Pt currently denies SI, HI, and Psychosis. At discharge, pt rates anxiety at 2/10 and depression at 2/10. Pt states that she does have a good supportive home environment and will followup with outpatient treatment. Affirms agreement with medication regimen and discharge plan. Denies other physical and psychological concerns at time of discharge.   Consults:  None  Significant Diagnostic Studies:  None  Discharge Vitals:   Blood pressure 114/69, pulse 84, temperature 97.6 F (36.4 C), temperature source Oral, resp. rate 20, height 5\' 2"  (1.575 m), weight 90.039 kg (198 lb 8 oz), last menstrual period 09/27/2013, SpO2 99.00%. Body mass index is 36.3 kg/(m^2). Lab Results:   No results found for this or any previous visit (from the past 72 hour(s)).  Physical Findings: AIMS: Facial and Oral  Movements Muscles of Facial Expression: None, normal Lips and Perioral Area: None, normal Jaw: None, normal Tongue: None, normal,Extremity Movements Upper (arms, wrists, hands, fingers): None, normal Lower (legs, knees, ankles, toes): None, normal, Trunk Movements Neck, shoulders, hips: None, normal, Overall Severity Severity of abnormal movements (highest score from questions above): None, normal Incapacitation due to abnormal movements: None, normal Patient's awareness of abnormal movements (rate only patient's report): No Awareness, Dental Status Current problems with teeth and/or dentures?: No Does patient usually wear dentures?: No  CIWA:  CIWA-Ar Total: 1 COWS:  COWS Total Score: 2  Psychiatric Specialty Exam: See Psychiatric Specialty Exam and Suicide Risk Assessment completed by Attending Physician prior to discharge.  Discharge destination:  Home  Is patient on multiple antipsychotic therapies at discharge:  No   Has Patient had three or more failed trials of antipsychotic monotherapy by history:  No  Recommended Plan for Multiple Antipsychotic Therapies: NA     Medication List       Indication   ARIPiprazole 10 MG tablet  Commonly known as:  ABILIFY  Take 1 tablet (10 mg total) by mouth daily.   Indication:  mood stabilization     zolpidem 5 MG tablet  Commonly known as:  AMBIEN  Take 0.5-1 tablets (2.5-5 mg total) by mouth at bedtime as needed for sleep (Take 1/2 tab before bedtime as needed; If more is needed, take 2nd half tab.).   Indication:  Trouble Sleeping           Follow-up Information   Follow up with [facility] On 10/04/2013. (Walk in on this date for hospital discharge appointment.  Walk in clinic is Monday - Friday 8 am - 3 pm.  They will than schedule you for medication management and therapy)    Contact information:   [address] [place] [address] Phone: [phone] Fax: [phone]      Follow-up recommendations:  Activity:  As  tolerated Diet:  Heart healthy with low sodium.  Comments:   Take all medications as prescribed. Keep all follow-up appointments as scheduled.  Do not consume alcohol or use illegal drugs while on prescription medications. Report any adverse effects from your medications to your primary care provider promptly.  In the event of recurrent symptoms or worsening symptoms, call 911, a crisis hotline, or go to the nearest emergency department for evaluation.   Total Discharge Time:  Greater than 30 minutes.  Signed: Beau Fanny, FNP-BC 10/05/2013, 4:24 PM  Patient was seen face to face for psychiatric evaluation, suicide risk assessment completed, case discussed with physician extender, and disposition plans made and reviewed the information documented and agree with the treatment plan.   Tayona Sarnowski,JANARDHAHA R. 10/05/2013 8:43 PM

## 2013-10-05 NOTE — MAU Note (Signed)
Patient presents with complaints of back pain from the neck down. Does not take pain medication because does not want to get addicted. Patient has is having left sided abdominal pain and bloody stools.

## 2013-10-17 IMAGING — CR DG CHEST 2V
2 series · 2 of 2 positions shown · non-contrast
Comparison: August 18, 2004

CLINICAL DATA: Chest pain

CHEST - 2 VIEW

[w chest pa]
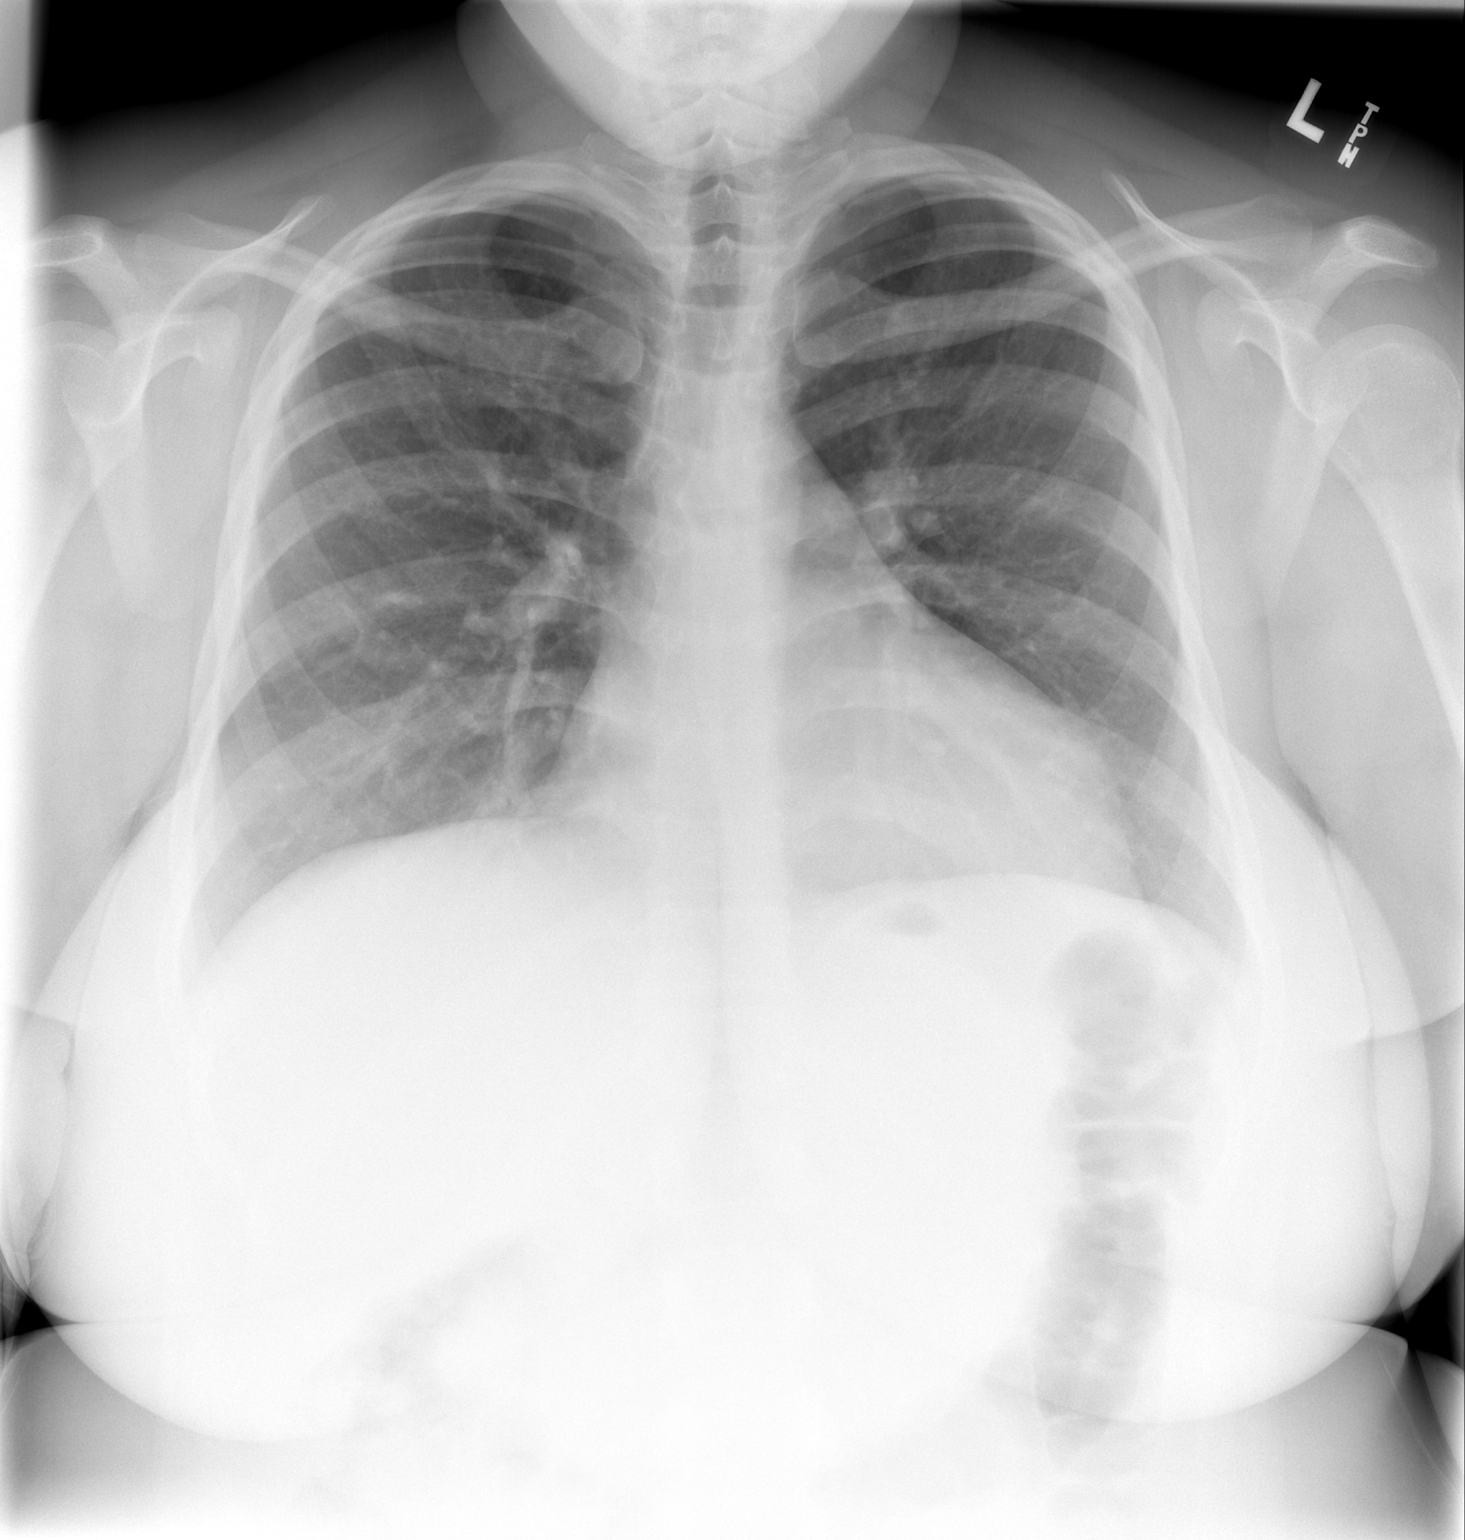

[w chest lat]
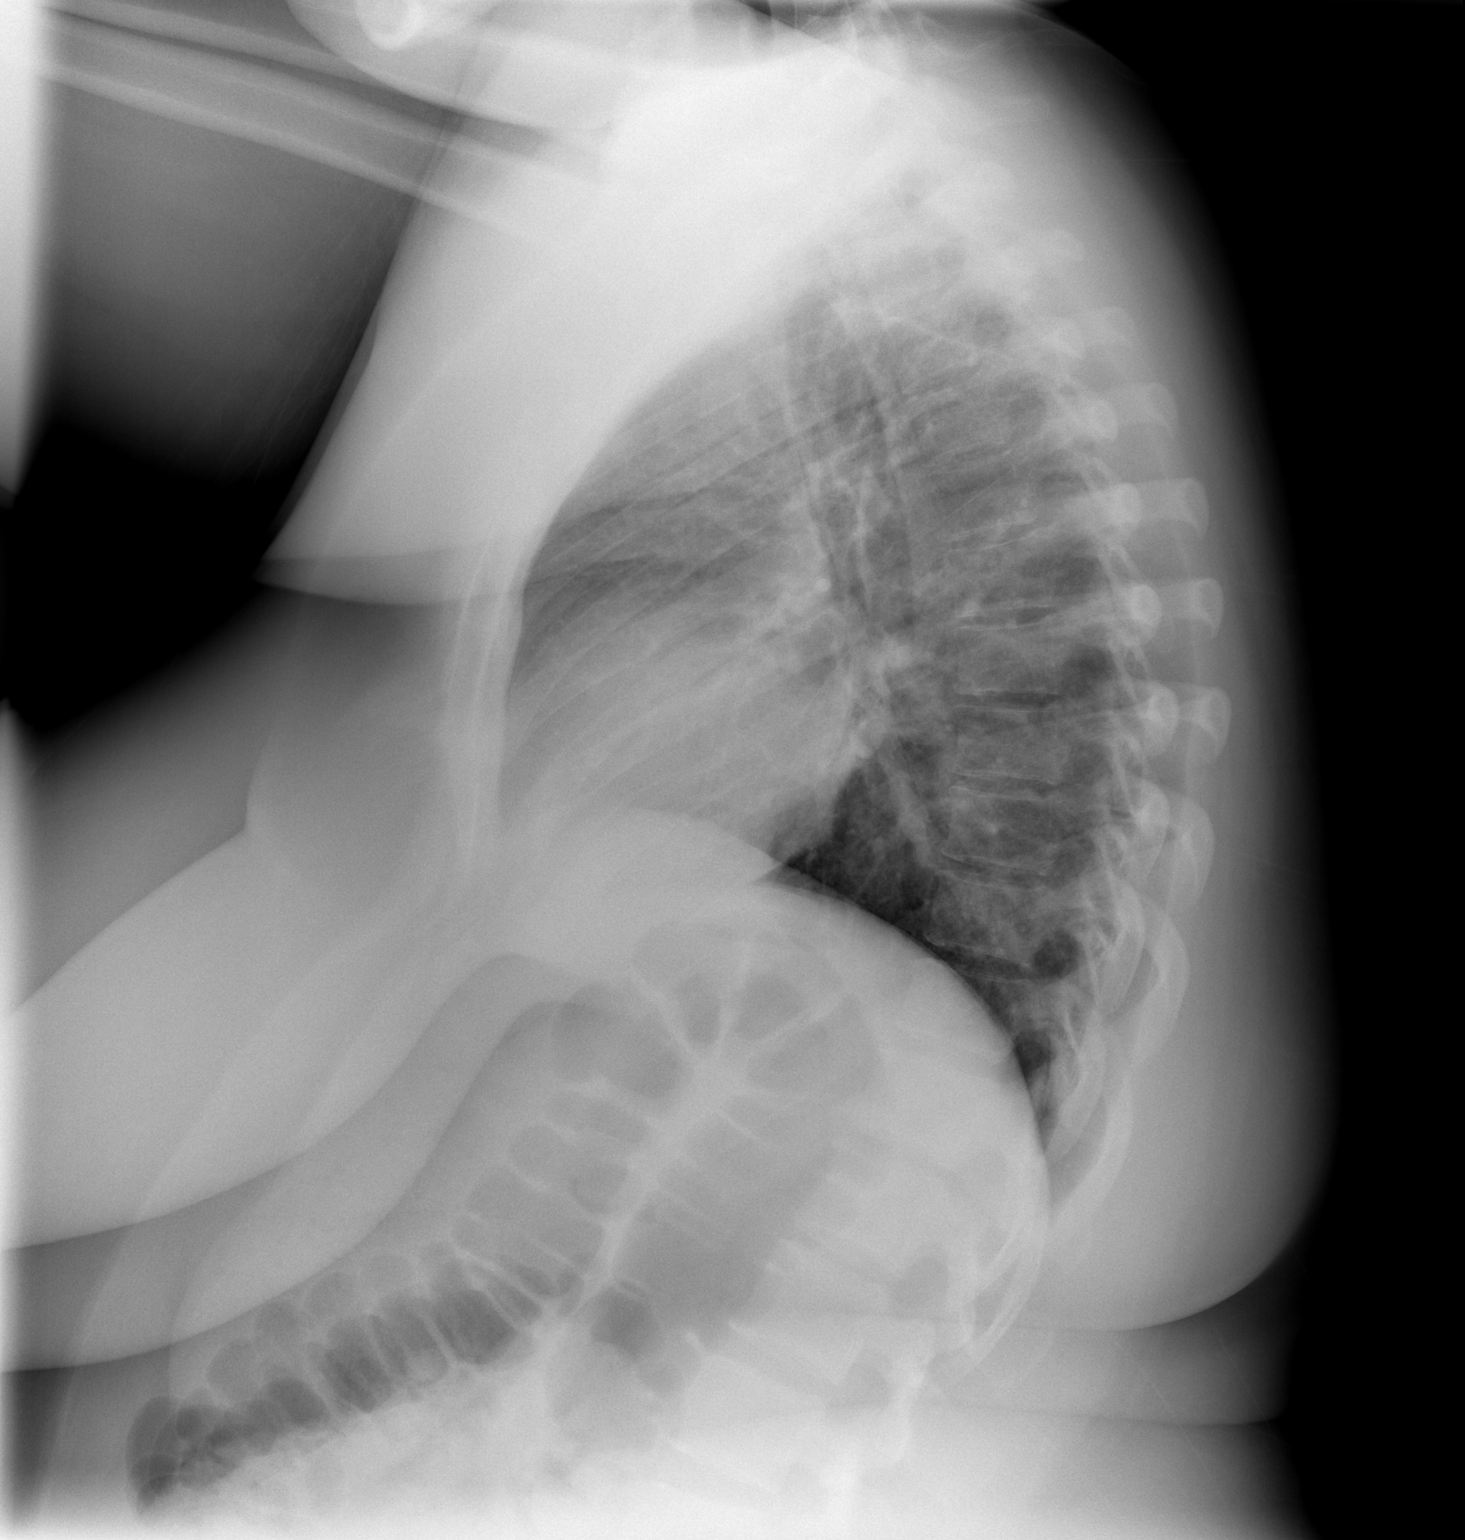

[2 of 2 positions shown; findings below may reference images not displayed]

FINDINGS: Lungs are clear.  Heart is upper normal in size with
normal pulmonary vascularity.  No adenopathy.  No bone lesions.
IMPRESSION: Lungs clear.

## 2018-07-02 ENCOUNTER — Emergency Department (HOSPITAL_COMMUNITY)
Admission: EM | Admit: 2018-07-02 | Discharge: 2018-07-02 | Disposition: A | Discharge status: Home / Self Care | Payer: Self-pay | Attending: Emergency Medicine | Admitting: Emergency Medicine

## 2018-07-02 ENCOUNTER — Encounter (HOSPITAL_COMMUNITY): Payer: Self-pay | Admitting: Emergency Medicine

## 2018-07-02 DIAGNOSIS — K047 Periapical abscess without sinus: Secondary | ICD-10-CM | POA: Insufficient documentation

## 2018-07-02 DIAGNOSIS — F17210 Nicotine dependence, cigarettes, uncomplicated: Secondary | ICD-10-CM | POA: Insufficient documentation

## 2018-07-02 DIAGNOSIS — Z79899 Other long term (current) drug therapy: Secondary | ICD-10-CM | POA: Insufficient documentation

## 2018-07-02 DIAGNOSIS — K0889 Other specified disorders of teeth and supporting structures: Secondary | ICD-10-CM

## 2018-07-02 MED ORDER — IBUPROFEN 400 MG PO TABS
600.0000 mg | ORAL_TABLET | Freq: Once | ORAL | Status: AC
  Administered 2018-07-02: 600 mg via ORAL
  Filled 2018-07-02: qty 1

## 2018-07-02 MED ORDER — IBUPROFEN 600 MG PO TABS: 600.0000 mg | ORAL_TABLET | Freq: Four times a day (QID) | ORAL | 0 refills | Status: DC | PRN

## 2018-07-02 MED ORDER — AMOXICILLIN-POT CLAVULANATE 875-125 MG PO TABS
1.0000 | ORAL_TABLET | Freq: Two times a day (BID) | ORAL | 0 refills | Status: DC
Start: 2018-07-02 — End: 2019-03-14

## 2018-07-02 MED ORDER — AMOXICILLIN-POT CLAVULANATE 875-125 MG PO TABS
1.0000 | ORAL_TABLET | Freq: Once | ORAL | Status: AC
  Administered 2018-07-02: 1 via ORAL
  Filled 2018-07-02: qty 1

## 2018-07-02 NOTE — ED Triage Notes (Signed)
Pt states she has a upper left tooth that is broken and developed an abscess yesterday. Pt gargled with salt water and it had some bloody drainage.

## 2018-07-02 NOTE — Discharge Instructions (Signed)
Please take entire course of antibiotics as directed.  Continue using ibuprofen and Tylenol, as well as prescribed Orajel for pain.  You will need to follow-up with your dentist for continued management of this.  Return to the emergency department for fevers, swelling or pain under the tongue or in the neck, difficulty breathing or swallowing or any other new or concerning symptoms. 

## 2018-07-02 NOTE — ED Provider Notes (Signed)
[facility] Provider Note   CSN: [id number] Arrival date & time: 07/02/18  [id number]     History   Chief Complaint Chief Complaint  Patient presents with  . Abscess  . Dental Pain    HPI Savannah [name] is a 32 y.o. female.  Savannah CreekBritney L [name] is a 32 y.o. Female with a history of depression and kidney infections, who presents to the emergency department for evaluation of pain and swelling around a broken tooth.  She reports that her left upper posterior molars broke off several months ago and she has intermittently had some pain and discomfort in this area, but about 2 days ago she noticed pain and swelling in the gums around this area and yesterday she gargled with some salt water and the area seemed to drain some bloody drainage.  She has not had any additional drainage since then.  She is taken some aspirin for pain without relief and has not tried anything else.  She does not have a dentist she regularly follows with.  She denies fevers or chills, no nausea or vomiting.  No pain or swelling under the tongue she reports a small amount of swelling over the face, but no pain or swelling around the eye.  No difficulty swallowing or breathing.     Past Medical History:  Diagnosis Date  . Depression   . Kidney infection     Patient Active Problem List   Diagnosis Date Noted  . Psychosis (HCC) 09/28/2013  . 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 No pertinent surgical history.   OB History   None      Home Medications    Prior to Admission medications   Medication Sig Start Date End Date Taking? Authorizing Provider  amoxicillin-clavulanate (AUGMENTIN) 875-125 MG tablet Take 1 tablet by mouth 2 (two) times daily. One po bid x 7 days 07/02/18   Dartha LodgeFord, Lynne Righi N, PA-C  ARIPiprazole (ABILIFY) 10 MG tablet Take 1 tablet (10 mg total) by mouth daily. 10/02/13   Withrow, Everardo AllJohn C, FNP  ciprofloxacin (CIPRO) 250 MG  tablet Take 1 tablet (250 mg total) by mouth every 12 (twelve) hours. 10/05/13   Marny LowensteinWenzel, Julie N, PA-C  ibuprofen (ADVIL,MOTRIN) 600 MG tablet Take 1 tablet (600 mg total) by mouth every 6 (six) hours as needed. 07/02/18   Dartha LodgeFord, Lakecia Deschamps N, PA-C  zolpidem (AMBIEN) 5 MG tablet Take 0.5-1 tablets (2.5-5 mg total) by mouth at bedtime as needed for sleep (Take 1/2 tab before bedtime as needed; If more is needed, take 2nd half tab.). 10/02/13   Withrow, Everardo AllJohn C, FNP    Family History Family History  Problem Relation Age of Onset  . Diabetes Other   . Hypertension Other   . Stroke Other     Social History Social History   Tobacco Use  . Smoking status: Current Every Day Smoker    Packs/day: 0.30    Types: Cigarettes  . Smokeless tobacco: Never Used  Substance Use Topics  . Alcohol use: Yes    Comment: Occasional Drinker  . Drug use: Yes    Types: Marijuana     Allergies   Sudafed [pseudoephedrine]   Review of Systems Review of Systems  Constitutional: Negative for chills and fever.  HENT: Positive for dental problem and facial swelling. Negative for sore throat, trouble swallowing and voice change.   Eyes: Negative for visual disturbance.  Respiratory: Negative for shortness of breath, wheezing and stridor.  Gastrointestinal: Negative for nausea and vomiting.  Musculoskeletal: Negative for neck pain and neck stiffness.  Skin: Negative for color change and rash.  Neurological: Negative for headaches.     Physical Exam Updated Vital Signs BP 124/87 (BP Location: Right Arm)   Pulse (!) 103   Temp 98.6 F (37 C) (Oral)   Resp 16   LMP 04/25/2018   SpO2 100%   Physical Exam  Constitutional: She appears well-developed and well-nourished. No distress.  HENT:  Head: Normocephalic and atraumatic.  Left upper posterior molars are broken off at the level of the gumline and there is surrounding erythema and edema of the gums, with no obvious drainable abscess, there is no active  drainage at this time.  The area is very tender to palpation.  There is no sublingual swelling or tenderness, that the posterior oropharynx is clear and moist and patient is tolerating secretions, has normal phonation, and no trismus or torticollis.  There is a small amount of palpable facial swelling, no facial swelling around the orbit.  Eyes: Right eye exhibits no discharge. Left eye exhibits no discharge.  Neck: Neck supple.  No stridor.  Pulmonary/Chest: Effort normal. No respiratory distress.  Lymphadenopathy:    She has no cervical adenopathy.  Neurological: She is alert. Coordination normal.  Skin: She is not diaphoretic.  Psychiatric: She has a normal mood and affect. Her behavior is normal.  Nursing note and vitals reviewed.    ED Treatments / Results  Labs (all labs ordered are listed, but only abnormal results are displayed) Labs Reviewed - No data to display  EKG None  Radiology No results found.  Procedures Procedures (including critical care time)  Medications Ordered in ED Medications  amoxicillin-clavulanate (AUGMENTIN) 875-125 MG per tablet 1 tablet (1 tablet Oral Given 07/02/18 0758)  ibuprofen (ADVIL,MOTRIN) tablet 600 mg (600 mg Oral Given 07/02/18 0758)     Initial Impression / Assessment and Plan / ED Course  I have reviewed the triage vital signs and the nursing notes.  Pertinent labs & imaging results that were available during my care of the patient were reviewed by me and considered in my medical decision making (see chart for details).  Patient with toothache.  No gross abscess.  Exam unconcerning for Ludwig's angina or spread of infection.  Will treat with Augmentin, benzocaine and anti-inflammatories medicine.  Urged patient to follow-up with dentist, dental resources provided.  Final Clinical Impressions(s) / ED Diagnoses   Final diagnoses:  Dental infection  Pain, dental    ED Discharge Orders         Ordered     amoxicillin-clavulanate (AUGMENTIN) 875-125 MG tablet  2 times daily     07/02/18 0753    ibuprofen (ADVIL,MOTRIN) 600 MG tablet  Every 6 hours PRN     07/02/18 0753           Dartha Lodge, PA-C 07/02/18 1610    Azalia Bilis, MD 07/02/18 1708

## 2019-03-14 ENCOUNTER — Other Ambulatory Visit: Payer: Self-pay

## 2019-03-14 ENCOUNTER — Inpatient Hospital Stay (HOSPITAL_COMMUNITY)
Admission: AD | Admit: 2019-03-14 | Discharge: 2019-03-14 | Disposition: A | Discharge status: Home / Self Care | Payer: Self-pay | Attending: Obstetrics and Gynecology | Admitting: Obstetrics and Gynecology

## 2019-03-14 DIAGNOSIS — Z3202 Encounter for pregnancy test, result negative: Secondary | ICD-10-CM | POA: Insufficient documentation

## 2019-03-14 DIAGNOSIS — N939 Abnormal uterine and vaginal bleeding, unspecified: Secondary | ICD-10-CM | POA: Insufficient documentation

## 2019-03-14 LAB — POCT PREGNANCY, URINE: Preg Test, Ur: NEGATIVE

## 2019-03-14 NOTE — MAU Note (Signed)
Discharged out of triage by Maryelizabeth Kaufmann CNM

## 2019-03-14 NOTE — MAU Provider Note (Signed)
First Provider Initiated Contact with Patient 03/14/19 1314     S [name] is a 32 y.o. No obstetric history on file. non-pregnant female who presents to MAU today with complaint of spotting and cramping, including passing a large clot when she voided earlier today. She is concerned that she is having a miscarriage. She has not taken a home pregnancy test.  She endorses 3 month history of irregular periods, does not use birth control. She does not have a PCP or OB/GYN provider.  O BP 134/87 (BP Location: Right Arm)   Pulse 78   Temp 97.9 F (36.6 C) (Oral)   Resp 18   LMP 12/20/2018   SpO2 100%  Physical Exam  Nursing note and vitals reviewed. Constitutional: She is oriented to person, place, and time. She appears well-developed and well-nourished.  Cardiovascular: Normal rate.  Respiratory: Effort normal. No respiratory distress.  Neurological: She is alert and oriented to person, place, and time.  Skin: Skin is warm and dry.  Psychiatric: She has a normal mood and affect. Her behavior is normal. Judgment and thought content normal.    A Non pregnant female Medical screening exam complete Hemodynamically stable  P Given list of GYNs in [facility] Discharge from MAU in stable condition Patient declined option of transfer to [facility] for further evaluation.  Also discussed option to seek care in outpatient facility of choice List of options for follow-up given  Warning signs for worsening condition that would warrant emergency follow-up discussed Patient may return to MAU as needed for pregnancy related complaints   Mallie Snooks, CNM 03/14/2019 1:24 PM

## 2019-03-14 NOTE — MAU Note (Signed)
Savannah [name] is a 33 y.o. at Unknown here in MAU reporting: has hx of irregular periods. This morning had some spotting and cramping, passed a large clot on the toilet. Thinks she may be pregnant, has not done a HPT.  LMP: 12/20/18  Onset of complaint: this AM  Pain score: 9/10, has not tried any medication   Vitals:   03/14/19 1316  BP: 134/87  Pulse: 78  Resp: 18  Temp: 97.9 F (36.6 C)  SpO2: 100%      Lab orders placed from triage: UPT

## 2019-05-13 ENCOUNTER — Other Ambulatory Visit: Payer: Self-pay

## 2019-05-13 ENCOUNTER — Ambulatory Visit (HOSPITAL_COMMUNITY)
Admission: EM | Admit: 2019-05-13 | Discharge: 2019-05-13 | Disposition: A | Discharge status: Home / Self Care | Payer: Self-pay | Attending: Family Medicine | Admitting: Family Medicine

## 2019-05-13 ENCOUNTER — Encounter (HOSPITAL_COMMUNITY): Payer: Self-pay | Admitting: Emergency Medicine

## 2019-05-13 DIAGNOSIS — J209 Acute bronchitis, unspecified: Secondary | ICD-10-CM

## 2019-05-13 DIAGNOSIS — R062 Wheezing: Secondary | ICD-10-CM

## 2019-05-13 MED ORDER — ALBUTEROL SULFATE HFA 108 (90 BASE) MCG/ACT IN AERS
2.0000 | INHALATION_SPRAY | Freq: Once | RESPIRATORY_TRACT | Status: AC
  Administered 2019-05-13: 2 via RESPIRATORY_TRACT

## 2019-05-13 MED ORDER — BENZONATATE 200 MG PO CAPS: 200.0000 mg | ORAL_CAPSULE | Freq: Two times a day (BID) | ORAL | 0 refills | Status: DC | PRN

## 2019-05-13 MED ORDER — AMOXICILLIN 875 MG PO TABS: 875.0000 mg | ORAL_TABLET | Freq: Two times a day (BID) | ORAL | 0 refills | Status: DC

## 2019-05-13 MED ORDER — ALBUTEROL SULFATE HFA 108 (90 BASE) MCG/ACT IN AERS
INHALATION_SPRAY | RESPIRATORY_TRACT | Status: AC
  Filled 2019-05-13: qty 6.7

## 2019-05-13 MED ORDER — ALBUTEROL SULFATE HFA 108 (90 BASE) MCG/ACT IN AERS: 1.0000 | INHALATION_SPRAY | Freq: Four times a day (QID) | RESPIRATORY_TRACT | 0 refills | Status: DC | PRN

## 2019-05-13 NOTE — ED Triage Notes (Signed)
Pt here for cough; pt sts tested for covid recently and was negative; pt thinks that it is from her work environment; denies fever

## 2019-05-13 NOTE — ED Provider Notes (Signed)
[facility]    CSN: [id number] Arrival date & time: 05/13/19  1023      History   Chief Complaint Chief Complaint  Patient presents with  . Cough    HPI Savannah [name] is a 33 y.o. female.   HPI  Patient states that there are some strong fumes at her workplace.  When she works in certain areas she feels short of breath and then develops a cough.  She is never had asthma or difficulty breathing before.  She is currently has a cough.  She is told her employer she thinks is from her workplace.  They told her to go get medical care, get treated for her cough, and when she comes back to work they will place her in a new work area away from these fumes.  Patient states that the glue.  It smells very strong. Currently she states that she has been coughing for about a week.  She has harsh cough that is hard to control at times.  Her mother gave her an albuterol inhaler to try.  She thinks is empty, does not help her very much.  She has not had any reaction to other problems.  No fever or chills.  No sputum production.  She is feeling somewhat tired.  No known coronavirus or illness exposure.  Past Medical History:  Diagnosis Date  . Depression   . Kidney infection     Patient Active Problem List   Diagnosis Date Noted  . Psychosis (HCC) 09/28/2013  . 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 No pertinent surgical history.  OB History   No obstetric history on file.      Home Medications    Prior to Admission medications   Medication Sig Start Date End Date Taking? Authorizing Provider  albuterol (VENTOLIN HFA) 108 (90 Base) MCG/ACT inhaler Inhale 1-2 puffs into the lungs every 6 (six) hours as needed for wheezing or shortness of breath. 05/13/19   Eustace Moore, MD  amoxicillin (AMOXIL) 875 MG tablet Take 1 tablet (875 mg total) by mouth 2 (two) times daily. 05/13/19   Eustace Moore, MD  benzonatate (TESSALON) 200 MG capsule Take  1 capsule (200 mg total) by mouth 2 (two) times daily as needed for cough. 05/13/19   Eustace Moore, MD    Family History Family History  Problem Relation Age of Onset  . Diabetes Other   . Hypertension Other   . Stroke Other     Social History Social History   Tobacco Use  . Smoking status: Current Every Day Smoker    Packs/day: 0.30    Types: Cigarettes  . Smokeless tobacco: Never Used  Substance Use Topics  . Alcohol use: Yes    Comment: Occasional Drinker  . Drug use: Yes    Types: Marijuana     Allergies   Sudafed [pseudoephedrine]   Review of Systems Review of Systems  Constitutional: Positive for fatigue. Negative for chills and fever.  HENT: Negative for ear pain and sore throat.   Eyes: Negative for pain and visual disturbance.  Respiratory: Positive for cough, shortness of breath and wheezing.   Cardiovascular: Negative for chest pain and palpitations.  Gastrointestinal: Negative for abdominal pain and vomiting.  Genitourinary: Negative for dysuria and hematuria.  Musculoskeletal: Negative for arthralgias and back pain.  Skin: Negative for color change and rash.  Neurological: Negative for seizures and syncope.  All other systems reviewed and are negative.  Physical Exam Triage Vital Signs ED Triage Vitals  Enc Vitals Group     BP 05/13/19 1057 (!) 152/95     Pulse Rate 05/13/19 1057 97     Resp 05/13/19 1057 17     Temp 05/13/19 1057 97.7 F (36.5 C)     Temp Source 05/13/19 1057 Tympanic     SpO2 05/13/19 1057 100 %     Weight --      Height --      Head Circumference --      Peak Flow --      Pain Score 05/13/19 1104 0     Pain Loc --      Pain Edu? --      Excl. in [place]? --    No data found.  Updated Vital Signs BP (!) 152/95 (BP Location: Left Arm)   Pulse 97   Temp 97.7 F (36.5 C) (Tympanic)   Resp 17   SpO2 100%      Physical Exam Constitutional:      General: She is not in acute distress.    Appearance: She is  well-developed. She is obese.  HENT:     Head: Normocephalic and atraumatic.     Right Ear: Tympanic membrane and ear canal normal.     Left Ear: Ear canal normal.     Nose: Nose normal. No congestion.     Mouth/Throat:     Mouth: Mucous membranes are moist.  Eyes:     Conjunctiva/sclera: Conjunctivae normal.     Pupils: Pupils are equal, round, and reactive to light.  Neck:     Musculoskeletal: Normal range of motion.  Cardiovascular:     Rate and Rhythm: Normal rate and regular rhythm.     Heart sounds: Normal heart sounds.  Pulmonary:     Effort: Pulmonary effort is normal. No respiratory distress.     Breath sounds: Wheezing and rhonchi present.     Comments: Wheeze throughout both lungs.  Scattered rhonchi centrally Abdominal:     General: There is no distension.     Palpations: Abdomen is soft.  Musculoskeletal: Normal range of motion.  Skin:    General: Skin is warm and dry.  Neurological:     Mental Status: She is alert.  Psychiatric:        Mood and Affect: Mood normal.        Behavior: Behavior normal.      UC Treatments / Results  Labs (all labs ordered are listed, but only abnormal results are displayed) Labs Reviewed - No data to display  EKG   Radiology No results found.  Procedures Procedures (including critical care time)  Medications Ordered in UC Medications  albuterol (VENTOLIN HFA) 108 (90 Base) MCG/ACT inhaler 2 puff (2 puffs Inhalation Given 05/13/19 1127)  albuterol (VENTOLIN HFA) 108 (90 Base) MCG/ACT inhaler (has no administration in time range)    Initial Impression / Assessment and Plan / UC Course  I have reviewed the triage vital signs and the nursing notes.  Pertinent labs & imaging results that were available during my care of the patient were reviewed by me and considered in my medical decision making (see chart for details).     I believe the patient does suffer bronchitis.  She has a fair amount of wheezing.  This may be  work-related, impossible to tell.  For now we will treat the bronchitis, have her off a few days, and she will go back to work  in a new area.  Hopefully this will take care of her problem.  She needs to follow-up with her primary care doctor if she fails to improve. Final Clinical Impressions(s) / UC Diagnoses   Final diagnoses:  Acute bronchitis, unspecified organism  Wheezing     Discharge Instructions     Drink plenty of fluids Use albuterol as needed for wheezing or shortness of breath Take antibiotic 2 times a day.  Take 2 doses today Take cough medicine 2 times a day.  Take 2 doses today (1 now and 1 at bedtime) Call or return if not better in a week   ED Prescriptions    Medication Sig Dispense Auth. Provider   albuterol (VENTOLIN HFA) 108 (90 Base) MCG/ACT inhaler Inhale 1-2 puffs into the lungs every 6 (six) hours as needed for wheezing or shortness of breath. 18 g Eustace MooreNelson, Jewett Mcgann Sue, MD   amoxicillin (AMOXIL) 875 MG tablet Take 1 tablet (875 mg total) by mouth 2 (two) times daily. 14 tablet Eustace MooreNelson, Annagrace Carr Sue, MD   benzonatate (TESSALON) 200 MG capsule Take 1 capsule (200 mg total) by mouth 2 (two) times daily as needed for cough. 20 capsule Eustace MooreNelson, Blondell Laperle Sue, MD     PDMP not reviewed this encounter.   Eustace MooreNelson, Diandre Merica Sue, MD 05/13/19 2236

## 2019-05-13 NOTE — Discharge Instructions (Signed)
Drink plenty of fluids Use albuterol as needed for wheezing or shortness of breath Take antibiotic 2 times a day.  Take 2 doses today Take cough medicine 2 times a day.  Take 2 doses today (1 now and 1 at bedtime) Call or return if not better in a week

## 2019-07-30 ENCOUNTER — Emergency Department (HOSPITAL_COMMUNITY): Admission: EM | Admit: 2019-07-30 | Discharge: 2019-07-30 | Discharge status: Against Medical Advice | Payer: Self-pay

## 2019-07-30 ENCOUNTER — Other Ambulatory Visit: Payer: Self-pay

## 2019-07-30 ENCOUNTER — Emergency Department (HOSPITAL_COMMUNITY)
Admission: EM | Admit: 2019-07-30 | Discharge: 2019-07-30 | Disposition: A | Discharge status: Home / Self Care | Payer: Self-pay | Attending: Emergency Medicine | Admitting: Emergency Medicine

## 2019-07-30 DIAGNOSIS — R4182 Altered mental status, unspecified: Secondary | ICD-10-CM

## 2019-07-30 DIAGNOSIS — Z046 Encounter for general psychiatric examination, requested by authority: Secondary | ICD-10-CM | POA: Insufficient documentation

## 2019-07-30 DIAGNOSIS — E669 Obesity, unspecified: Secondary | ICD-10-CM | POA: Insufficient documentation

## 2019-07-30 DIAGNOSIS — F17210 Nicotine dependence, cigarettes, uncomplicated: Secondary | ICD-10-CM | POA: Insufficient documentation

## 2019-07-30 DIAGNOSIS — F23 Brief psychotic disorder: Secondary | ICD-10-CM | POA: Insufficient documentation

## 2019-07-30 DIAGNOSIS — F29 Unspecified psychosis not due to a substance or known physiological condition: Secondary | ICD-10-CM

## 2019-07-30 LAB — COMPREHENSIVE METABOLIC PANEL
ALT: 15 U/L (ref 0–44)
AST: 28 U/L (ref 15–41)
Albumin: 4.6 g/dL (ref 3.5–5.0)
Alkaline Phosphatase: 70 U/L (ref 38–126)
Anion gap: 12 (ref 5–15)
BUN: 5 mg/dL — ABNORMAL LOW (ref 6–20)
CO2: 22 mmol/L (ref 22–32)
Calcium: 9.6 mg/dL (ref 8.9–10.3)
Chloride: 106 mmol/L (ref 98–111)
Creatinine, Ser: 0.78 mg/dL (ref 0.44–1.00)
GFR calc Af Amer: >60 mL/min (ref >60)
GFR calc non Af Amer: >60 mL/min (ref >60)
Glucose, Bld: 130 mg/dL — ABNORMAL HIGH (ref 70–99)
Potassium: 3.3 mmol/L — ABNORMAL LOW (ref 3.5–5.1)
Sodium: 140 mmol/L (ref 135–145)
Total Bilirubin: 1 mg/dL (ref 0.3–1.2)
Total Protein: 8.1 g/dL (ref 6.5–8.1)

## 2019-07-30 LAB — CBC WITH DIFFERENTIAL/PLATELET
Abs Immature Granulocytes: 0.04 10*3/uL (ref 0.00–0.07)
Basophils Absolute: 0.1 10*3/uL (ref 0.0–0.1)
Basophils Relative: 1 %
Eosinophils Absolute: 0 10*3/uL (ref 0.0–0.5)
Eosinophils Relative: 0 %
HCT: 45.8 % (ref 36.0–46.0)
Hemoglobin: 14.3 g/dL (ref 12.0–15.0)
Immature Granulocytes: 1 %
Lymphocytes Relative: 29 %
Lymphs Abs: 2.3 10*3/uL (ref 0.7–4.0)
MCH: 25.6 pg — ABNORMAL LOW (ref 26.0–34.0)
MCHC: 31.2 g/dL (ref 30.0–36.0)
MCV: 82.1 fL (ref 80.0–100.0)
Monocytes Absolute: 0.7 10*3/uL (ref 0.1–1.0)
Monocytes Relative: 9 %
Neutro Abs: 4.9 10*3/uL (ref 1.7–7.7)
Neutrophils Relative %: 60 %
Platelets: 204 10*3/uL (ref 150–400)
RBC: 5.58 MIL/uL — ABNORMAL HIGH (ref 3.87–5.11)
RDW: 17.4 % — ABNORMAL HIGH (ref 11.5–15.5)
WBC: 8 10*3/uL (ref 4.0–10.5)
nRBC: 0 % (ref 0.0–0.2)

## 2019-07-30 LAB — HCG, QUANTITATIVE, PREGNANCY: hCG, Beta Chain, Quant, S: 1 m[IU]/mL (ref <5)

## 2019-07-30 LAB — RAPID URINE DRUG SCREEN, HOSP PERFORMED
Amphetamines: NOT DETECTED
Barbiturates: NOT DETECTED
Benzodiazepines: NOT DETECTED
Cocaine: NOT DETECTED
Opiates: NOT DETECTED
Tetrahydrocannabinol: POSITIVE — AB

## 2019-07-30 LAB — SALICYLATE LEVEL: Salicylate Lvl: <7 mg/dL — ABNORMAL LOW (ref 7.0–30.0)

## 2019-07-30 LAB — ACETAMINOPHEN LEVEL: Acetaminophen (Tylenol), Serum: <10 ug/mL — ABNORMAL LOW (ref 10–30)

## 2019-07-30 LAB — ETHANOL: Alcohol, Ethyl (B): <10 mg/dL (ref <10)

## 2019-07-30 MED ORDER — LORAZEPAM 2 MG/ML IJ SOLN
1.0000 mg | Freq: Once | INTRAMUSCULAR | Status: AC
  Administered 2019-07-30: 1 mg via INTRAMUSCULAR
  Filled 2019-07-30: qty 1

## 2019-07-30 MED ORDER — ALUM & MAG HYDROXIDE-SIMETH 200-200-20 MG/5ML PO SUSP: 30.0000 mL | Freq: Four times a day (QID) | ORAL | Status: DC | PRN

## 2019-07-30 MED ORDER — STERILE WATER FOR INJECTION IJ SOLN
INTRAMUSCULAR | Status: AC
  Administered 2019-07-30: 10 mL
  Filled 2019-07-30: qty 10

## 2019-07-30 MED ORDER — ACETAMINOPHEN 325 MG PO TABS: 650.0000 mg | ORAL_TABLET | ORAL | Status: DC | PRN

## 2019-07-30 MED ORDER — ONDANSETRON HCL 4 MG PO TABS: 4.0000 mg | ORAL_TABLET | Freq: Three times a day (TID) | ORAL | Status: DC | PRN

## 2019-07-30 MED ORDER — ZIPRASIDONE MESYLATE 20 MG IM SOLR
10.0000 mg | Freq: Once | INTRAMUSCULAR | Status: AC
  Administered 2019-07-30: 10 mg via INTRAMUSCULAR
  Filled 2019-07-30: qty 20

## 2019-07-30 NOTE — ED Provider Notes (Signed)
[facility] DEPT Provider Note   CSN: [id number] Arrival date & time: 07/30/19  1104     History Chief Complaint  Patient presents with  . Medical Clearance  . Psychiatric Evaluation    Savannah [name] is a 33 y.o. female with medical history significant for mood disorder, psychosis, last admission in 2015 who presents for evaluation of hallucinations.  In triage patient not able to answer assessment questions.  She is restless, nonsensical, rapid and pressured speech, speaking in circles.  She is loud, yelling, difficult to redirect.  She is pacing back and forth the statement.  She does tell me that she hears and sees things will not expound on that.  Denies SI, AVH.  Denies any illicit drug use or alcohol use.  Denies chance of pregnancy.  Denies any pain, medical problems, known Covid exposures.  Limited history due to patient's psychosis.  HPI     Past Medical History:  Diagnosis Date  . Depression   . Kidney infection     Patient Active Problem List   Diagnosis Date Noted  . Psychosis ([facility]) 09/28/2013  . Unspecified episodic mood disorder 09/27/2013    No past surgical history on file.   OB History   No obstetric history on file.     Family History  Problem Relation Age of Onset  . Diabetes Other   . Hypertension Other   . Stroke Other     Social History   Tobacco Use  . Smoking status: Current Every Day Smoker    Packs/day: 0.30    Types: Cigarettes  . Smokeless tobacco: Never Used  Substance Use Topics  . Alcohol use: Yes    Comment: Occasional Drinker  . Drug use: Yes    Types: Marijuana    Home Medications Prior to Admission medications   Medication Sig Start Date End Date Taking? Authorizing Provider  acetaminophen (TYLENOL) 325 MG tablet Take 650 mg by mouth every 6 (six) hours as needed for mild pain or headache.   Yes [provider]  albuterol (VENTOLIN HFA) 108 (90 Base) MCG/ACT inhaler  Inhale 1-2 puffs into the lungs every 6 (six) hours as needed for wheezing or shortness of breath. 05/13/19  Yes Raylene Everts, MD  amoxicillin (AMOXIL) 875 MG tablet Take 1 tablet (875 mg total) by mouth 2 (two) times daily. Patient not taking: Reported on 07/30/2019 05/13/19   Raylene Everts, MD  benzonatate (TESSALON) 200 MG capsule Take 1 capsule (200 mg total) by mouth 2 (two) times daily as needed for cough. Patient not taking: Reported on 07/30/2019 05/13/19   Raylene Everts, MD    Allergies    Sudafed [pseudoephedrine]  Review of Systems   Review of Systems  Unable to perform ROS: Psychiatric disorder  All other systems reviewed and are negative.   Physical Exam Updated Vital Signs BP (!) 149/113 (BP Location: Left Arm)   Pulse 88   Temp 98.2 F (36.8 C) (Oral)   Resp 20   SpO2 98%   Physical Exam Vitals and nursing note reviewed.  Constitutional:      General: She is not in acute distress.    Appearance: She is well-developed. She is obese. She is not ill-appearing or toxic-appearing.  HENT:     Head: Normocephalic and atraumatic.  Eyes:     Pupils: Pupils are equal, round, and reactive to light.  Cardiovascular:     Rate and Rhythm: Normal rate.  Pulmonary:  Effort: No respiratory distress.  Abdominal:     General: Bowel sounds are normal. There is no distension.  Musculoskeletal:        General: Normal range of motion.     Cervical back: Normal range of motion.     Comments: Moves all 4 extremities without difficulty  Skin:    General: Skin is warm and dry.     Capillary Refill: Capillary refill takes less than 2 seconds.  Neurological:     Mental Status: She is alert.  Psychiatric:        Attention and Perception: She perceives auditory and visual hallucinations.        Mood and Affect: Mood is elated. Affect is labile and inappropriate.        Speech: Speech is rapid and pressured.        Thought Content: Thought content is paranoid and  delusional. Thought content does not include homicidal or suicidal ideation. Thought content does not include homicidal or suicidal plan.        Cognition and Memory: Cognition is impaired.        Judgment: Judgment is impulsive and inappropriate.     Comments: Patient pacing around floor, banging on doors and windows psychiatric unit.  Patient in dressing and padding her stomach.  States "Jesus knows my truth."  States "demons are after her."  Her speech is elevated, labile.  Her speech is rapid and pressured.  She appears hyperactive.  Denies SI, AVH.  Admits to audio and visual hallucinations.  Patient appears paranoid and delusional.    ED Results / Procedures / Treatments   Labs (all labs ordered are listed, but only abnormal results are displayed) Labs Reviewed  COMPREHENSIVE METABOLIC PANEL - Abnormal; Notable for the following components:      Result Value   Potassium 3.3 (*)    Glucose, Bld 130 (*)    BUN 5 (*)    All other components within normal limits  RAPID URINE DRUG SCREEN, HOSP PERFORMED - Abnormal; Notable for the following components:   Tetrahydrocannabinol POSITIVE (*)    All other components within normal limits  CBC WITH DIFFERENTIAL/PLATELET - Abnormal; Notable for the following components:   RBC 5.58 (*)    MCH 25.6 (*)    RDW 17.4 (*)    All other components within normal limits  SALICYLATE LEVEL - Abnormal; Notable for the following components:   Salicylate Lvl <7.0 (*)    All other components within normal limits  ACETAMINOPHEN LEVEL - Abnormal; Notable for the following components:   Acetaminophen (Tylenol), Serum <10 (*)    All other components within normal limits  RESPIRATORY PANEL BY RT PCR (FLU A&B, COVID)  ETHANOL  HCG, QUANTITATIVE, PREGNANCY  I-STAT BETA HCG BLOOD, ED ([facility], [facility], [facility] ONLY)    EKG None  Radiology No results found.  Procedures Procedures (including critical care time)  Medications Ordered in ED Medications  acetaminophen  (TYLENOL) tablet 650 mg (has no administration in time range)  ondansetron (ZOFRAN) tablet 4 mg (has no administration in time range)  alum & mag hydroxide-simeth (MAALOX/MYLANTA) 200-200-20 MG/5ML suspension 30 mL (has no administration in time range)  ziprasidone (GEODON) injection 10 mg (10 mg Intramuscular Given 07/30/19 1206)  LORazepam (ATIVAN) injection 1 mg (1 mg Intramuscular Given 07/30/19 1207)  sterile water (preservative free) injection (10 mLs  Given 07/30/19 1207)   ED Course  I have reviewed the triage vital signs and the nursing notes.  Pertinent labs &  imaging results that were available during my care of the patient were reviewed by me and considered in my medical decision making (see chart for details).  33 year old female presents acutely psychotic.  She is being various body parts against windows and door in a psychiatric unit.  Her speech is rapid and pressured.  Patient keeps stating "Jesus knows my truth, demons are after me."  She has elevated and labile and is very hyperactive.  Audiovisual hallucinations.  She appears paranoid and delusional.  Given she keeps banging various body parts against health services, patient given Geodon and Ativan with concerns of harming herself.  Patient placed under involuntary commitment due to safety for herself.  Plan for psych labs, psychiatry consult.  Patient continues to [facility] to herself [facility] stating "Jesus know me." She is able to redirect back into room.  Labs without significant findings.  Patient medically cleared.  Disposition per TTS. Psych hold orders placed.  Patient over last 4 hours as slept.  She is now calm and cooperative.  She is oriented to person, place and time.  Denies SI, HI.  Patient states that he has not been eating and sleeping at home.  She is under stress.  Patient has been assessed by attending physician, Dr. Juleen ChinaKohut as well as psychiatry.  They have medically and psychiatrically cleared her.   Dr. Juleen ChinaKohut will rescind IVC.  Patient and husband have contracted for safety at home.  We will have [facility].  She is to return for any new worsening symptoms.  The patient has been appropriately medically screened and/or stabilized in the ED. I have low suspicion for any other emergent medical condition which would require further screening, evaluation or treatment in the ED or require inpatient management.  Patient is hemodynamically stable and in no acute distress.  Patient able to ambulate in department prior to ED.  Evaluation does not show acute pathology that would require ongoing or additional emergent interventions while in the emergency department or further inpatient treatment.  I have discussed the diagnosis with the patient and answered all questions.  Pain is been managed while in the emergency department and patient has no further complaints prior to discharge.  Patient is comfortable with plan discussed in room and is stable for discharge at this time.  I have discussed strict return precautions for returning to the emergency department.  Patient was encouraged to follow-up with PCP/specialist refer to at discharge.    MDM Rules/Calculators/A&P                       Final Clinical Impression(s) / ED Diagnoses Final diagnoses:  Psychosis, unspecified psychosis type (HCC)  Altered mental status, unspecified altered mental status type    Rx / DC Orders ED Discharge Orders    None       Laniece Hornbaker A, PA-C 07/30/19 Charlesetta [id number]    Rancour, Stephen, MD 07/30/19 2057

## 2019-07-30 NOTE — ED Notes (Signed)
Pt DCd off unit to home per provider. Pt alert, calm, cooperative, no s/s of distress. DC information given to and reviewed with pt, pt acknowledged understanding. Pt ambulatory off unit, escorted by RN. Pt transported by significant other.

## 2019-07-30 NOTE — ED Triage Notes (Signed)
Pt called from lobby twice with no answer.  

## 2019-07-30 NOTE — ED Notes (Signed)
Pt has been very active since coming on unit. Pt is very emotional, yelling and crying. Pt has taken off her clothes in the hallway. Pt is directable and cooperative with staff.

## 2019-07-30 NOTE — ED Notes (Signed)
Pt restless, laying on floor, labile, tearful, difficult to redirect.

## 2019-07-30 NOTE — ED Notes (Signed)
1 Pt Belonging Bag located in [address]

## 2019-07-30 NOTE — ED Notes (Signed)
Pt to room 40. Pt quiet, guarded, calm, cooperative. Pt oriented to unit. Pt resting comfortably.
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
## 2019-07-30 NOTE — ED Notes (Signed)
Pt was asked for a urine sample and staff watched pt go to the bathroom and pour Apple Juice in the cup and bring it back.Savannah [name]

## 2019-07-30 NOTE — ED Notes (Signed)
Pt is pacing the halls crying and talking to herself

## 2019-07-30 NOTE — ED Provider Notes (Signed)
Medical screening examination/treatment/procedure(s) were conducted as a shared visit with non-physician practitioner(s) and myself.  I personally evaluated the patient during the encounter.     PT presented in manic state. Needed chemical sedation. Now calm and cooperative. Odd affect but behavior is reasonable. Says she has been fasting and not sleeping recently. Evaluated by TTS. Deemed to be safe for disposition from psychiatric standpoint. I concur.    Virgel Manifold, MD 07/30/19 [phone]

## 2019-07-30 NOTE — ED Notes (Addendum)
No reply from pt in lobby x1

## 2019-07-30 NOTE — Discharge Instructions (Addendum)
Follow up outpatient with psychiatry. Return for new or worsening symptoms

## 2019-07-30 NOTE — ED Triage Notes (Signed)
Pt brought to room 40. Pt not able to answer assessment questions. Restless, nonsensical, loud, yelling, difficult to redirect.

## 2019-07-31 ENCOUNTER — Other Ambulatory Visit: Payer: Self-pay

## 2019-07-31 ENCOUNTER — Inpatient Hospital Stay (HOSPITAL_COMMUNITY)
Admission: AD | Admit: 2019-07-31 | Discharge: 2019-08-02 | DRG: 885 | Disposition: A | Discharge status: Home / Self Care | Payer: Federal, State, Local not specified - Other | Attending: Psychiatry | Admitting: Psychiatry

## 2019-07-31 ENCOUNTER — Other Ambulatory Visit: Payer: Self-pay | Admitting: Behavioral Health

## 2019-07-31 DIAGNOSIS — Z20828 Contact with and (suspected) exposure to other viral communicable diseases: Secondary | ICD-10-CM | POA: Diagnosis present

## 2019-07-31 DIAGNOSIS — Z23 Encounter for immunization: Secondary | ICD-10-CM

## 2019-07-31 DIAGNOSIS — F312 Bipolar disorder, current episode manic severe with psychotic features: Secondary | ICD-10-CM | POA: Diagnosis present

## 2019-07-31 DIAGNOSIS — F209 Schizophrenia, unspecified: Secondary | ICD-10-CM | POA: Diagnosis present

## 2019-07-31 LAB — RESPIRATORY PANEL BY RT PCR (FLU A&B, COVID)
Influenza A by PCR: NEGATIVE
Influenza B by PCR: NEGATIVE
SARS Coronavirus 2 by RT PCR: NEGATIVE

## 2019-07-31 MED ORDER — ZIPRASIDONE MESYLATE 20 MG IM SOLR
INTRAMUSCULAR | Status: AC
  Administered 2019-07-31: 14:00:00 20 mg via INTRAMUSCULAR
  Filled 2019-07-31: qty 20

## 2019-07-31 MED ORDER — HYDROXYZINE HCL 25 MG PO TABS
25.0000 mg | ORAL_TABLET | Freq: Three times a day (TID) | ORAL | Status: DC | PRN
  Administered 2019-07-31: 21:00:00 25 mg via ORAL
  Filled 2019-07-31: qty 1

## 2019-07-31 MED ORDER — LORAZEPAM 1 MG PO TABS: 1.0000 mg | ORAL_TABLET | ORAL | Status: AC | PRN

## 2019-07-31 MED ORDER — ZIPRASIDONE MESYLATE 20 MG IM SOLR: 20.0000 mg | Freq: Two times a day (BID) | INTRAMUSCULAR | Status: DC | PRN

## 2019-07-31 MED ORDER — INFLUENZA VAC SPLIT QUAD 0.5 ML IM SUSY
0.5000 mL | PREFILLED_SYRINGE | INTRAMUSCULAR | Status: AC
  Administered 2019-08-01: 09:00:00 0.5 mL via INTRAMUSCULAR
  Filled 2019-07-31: qty 0.5

## 2019-07-31 MED ORDER — TRAZODONE HCL 50 MG PO TABS
50.0000 mg | ORAL_TABLET | Freq: Every evening | ORAL | Status: DC | PRN
  Administered 2019-07-31: 21:00:00 50 mg via ORAL
  Filled 2019-07-31: qty 1

## 2019-07-31 MED ORDER — LORAZEPAM 0.5 MG PO TABS
ORAL_TABLET | ORAL | Status: AC
  Administered 2019-07-31: 14:00:00 1 mg via ORAL
  Filled 2019-07-31: qty 2
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
## 2019-07-31 NOTE — Progress Notes (Signed)
Patient ID: Savannah [name], female   DOB: 25-Mar-1986, 33 y.o.   MRN: [id number]   [place] NOVEL CORONAVIRUS (COVID-19) DAILY CHECK-OFF SYMPTOMS - answer yes or no to each - every day NO YES  Have you had a fever in the past 24 hours?  . Fever (Temp > 37.80C / 100F) X   Have you had any of these symptoms in the past 24 hours? . New Cough .  Sore Throat  .  Shortness of Breath .  Difficulty Breathing .  Unexplained Body Aches   X   Have you had any one of these symptoms in the past 24 hours not related to allergies?   . Runny Nose .  Nasal Congestion .  Sneezing   X   If you have had runny nose, nasal congestion, sneezing in the past 24 hours, has it worsened?  X   EXPOSURES - check yes or no X   Have you traveled outside the state in the past 14 days?  X   Have you been in contact with someone with a confirmed diagnosis of COVID-19 or PUI in the past 14 days without wearing appropriate PPE?  X   Have you been living in the same home as a person with confirmed diagnosis of COVID-19 or a PUI (household contact)?    X   Have you been diagnosed with COVID-19?    X              What to do next: Answered NO to all: Answered YES to anything:   Proceed with unit schedule Follow the [facility] Inpatient Flowsheet.

## 2019-07-31 NOTE — H&P (Addendum)
Behavioral Health Medical Screening Exam  Savannah [name] is an 33 y.o. female.who presents to [facility] voluntarily at this time although transported by police. Per staff, patient mother is in the process of completing IVC paperwork. Patient presents as very psychotic, delusional. Her thought process is disorganized and  Tangential. She is walking without shoes that are held in her hands. She paces when she first walks in the room although does sit down with redirect. When asked her name she states," Savannah [name].: On her assessment sheet, the answers to her questions are very scattered and disorganized. She says things unrelated to question such as," I got married. I tell the girl thank you Lord. My husband brought me here to drop off everything on my body, its a spiritual task on my body." When asked about hallucinations she stated," not anymore. I took it away staying truthful to my God." She states," they tried to give me schizophrenia but it takes patience and I had to cleanse to get out of it." She reports she is not taking medications and states," I had to break it by understanding what I should do. I had to cleanse."  She does state that she has been psychiatrically hospitalized att least four times.   Per chart review, patient was evaluated at [facility] ED yesterday, 07/30/2019. Per chart review, her presentation documented by MD was as follow;    33 year old female presents acutely psychotic.  She is being various body parts against windows and door in a psychiatric unit.  Her speech is rapid and pressured.  Patient keeps stating "Jesus knows my truth, demons are after me." She has elevated and labile and is very hyperactive.  Audiovisual hallucinations. She appears paranoid and delusional.    Patient pacing around floor, banging on doors and windows psychiatric unit. Patient in dressing and padding her stomach.  States "Jesus knows my truth." States "demons are after her."  Her speech is  elevated, labile.  Her speech is rapid and pressured.  She appears hyperactive.  Denies SI, AVH.  Admits to audio and visual hallucinations.  Patient appears paranoid and delusional.     Total Time spent with patient: 20 minutes  Psychiatric Specialty Exam: Physical Exam  Vitals reviewed. Neurological: She is alert.    Review of Systems  Psychiatric/Behavioral:       Psychotic/delusional     Blood pressure (!) 133/93, pulse 97, temperature 98.4 F (36.9 C), temperature source Oral, resp. rate 16, SpO2 100 %.There is no height or weight on file to calculate BMI.  General Appearance: Fairly Groomed  Eye Contact:  Fair  Speech:  Clear and Coherent and Normal Rate  Volume:  Normal  Mood:  Euphoric  Affect:  Labile  Thought Process:  Disorganized and Descriptions of Associations: Tangential  Orientation:  Full (Time, Place, and Person)  Thought Content:  Tangential and delusional   Suicidal Thoughts:  No  Homicidal Thoughts:  No  Memory:  Immediate;   Fair Recent;   Fair  Judgement:  Impaired  Insight:  Lacking  Psychomotor Activity:  Increased  Concentration: Concentration: Fair and Attention Span: Fair  Recall:  [employer] of Knowledge:Fair  Language: Good  Akathisia:  Negative  Handed:  Right  AIMS (if indicated):     Assets:  Resilience  Sleep:       Musculoskeletal: Strength & Muscle Tone: within normal limits Gait & Station: normal Patient leans: N/A  Blood pressure (!) 133/93, pulse 97, temperature 98.4 F (  36.9 C), temperature source Oral, resp. rate 16, SpO2 100 %.  Recommendations:  Based on my evaluation the patient does not appear to have an emergency medical condition.   Patient requires inpatient psychiatric hospitalization.  Patient will be assigned a bed here at [facility] on the [address] hall pending negative COVID.   Denzil Magnuson, NP 07/31/2019, 12:48 PM

## 2019-07-31 NOTE — Tx Team (Signed)
Initial Treatment Plan 07/31/2019 5:57 PM [name] OZY:[id number]    PATIENT STRESSORS: Medication change or noncompliance Occupational concerns   PATIENT STRENGTHS: Capable of independent living Special hobby/interest Supportive family/friends   PATIENT IDENTIFIED PROBLEMS: Psychosis  "Work"                   DISCHARGE CRITERIA:  Improved stabilization in mood, thinking, and/or behavior Reduction of life-threatening or endangering symptoms to within safe limits Verbal commitment to aftercare and medication compliance  PRELIMINARY DISCHARGE PLAN: Outpatient therapy Return to previous living arrangement Return to previous work or school arrangements  PATIENT/FAMILY INVOLVEMENT: This treatment plan has been presented to and reviewed with the patient, Savannah [name], and/or family member.  The patient and family have been given the opportunity to ask questions and make suggestions.  Harriet Masson, RN 07/31/2019, 5:57 PM

## 2019-07-31 NOTE — Progress Notes (Signed)
Patient ID: ARSENIA GORACKE, female   DOB: [birth year], 33 y.o.   MRN: [id number]   D: Pt alert and oriented during [facility] admission process. Pt denies SI/HI, A/VH, and any pain. Pt is cooperative.  A: Education, support, reassurance, and encouragement provided, q15 minute safety checks initiated. Pt's belongings in locker #8.    "LINDAANN GRADILLA is an 33 y.o. female.who presents to [facility] voluntarily at this time although transported by police. Per staff, patient mother is in the process of completing IVC paperwork. Patient presents as very psychotic, delusional. Her thought process is disorganized and  Tangential. She is walking without shoes that are held in her hands. She paces when she first walks in the room although does sit down with redirect. When asked her name she states," Tory Sandi Mariscal.: On her assessment sheet, the answers to her questions are very scattered and disorganized. She says things unrelated to question such as," I got married. I tell the girl thank you Lord. My husband brought me here to drop off everything on my body, its a spiritual task on my body." When asked about hallucinations she stated," not anymore. I took it away staying truthful to my God." She states," they tried to give me schizophrenia but it takes patience and I had to cleanse to get out of it." She reports she is not taking medications and states," I had to break it by understanding what I should do. I had to cleanse."  She does state that she has been psychiatrically hospitalized att least four times"  R: Pt denies any concerns at this time, and verbally contracts for safety. Pt ambulating on the unit with no issues. Pt remains safe on the unit.

## 2019-07-31 NOTE — Progress Notes (Signed)
   07/31/19 2100  Psych Admission Type (Psych Patients Only)  Admission Status Voluntary  Psychosocial Assessment  Patient Complaints Anxiety  Eye Contact Fair  Facial Expression Anxious  Affect Anxious  Speech Soft;Logical/coherent  Interaction Assertive  Motor Activity Slow  Appearance/Hygiene Unremarkable  Behavior Characteristics Cooperative  Mood Pleasant  Aggressive Behavior  Effect No apparent injury  Thought Process  Coherency WDL  Content WDL  Delusions WDL  Perception WDL  Hallucination None reported or observed  Judgment Poor  Confusion WDL  Danger to Self  Current suicidal ideation? Denies  Danger to Others  Danger to Others None reported or observed   Pt remembered writer from previous admission. Pt appeared to be minimizing her situation, but pt was appropriate with writer and requested something to help her sleep.

## 2019-08-01 DIAGNOSIS — F312 Bipolar disorder, current episode manic severe with psychotic features: Principal | ICD-10-CM

## 2019-08-01 LAB — LIPID PANEL
Cholesterol: 118 mg/dL (ref 0–200)
HDL: 33 mg/dL — ABNORMAL LOW (ref >40)
LDL Cholesterol: 75 mg/dL (ref 0–99)
Total CHOL/HDL Ratio: 3.6 RATIO
Triglycerides: 52 mg/dL (ref <150)
VLDL: 10 mg/dL (ref 0–40)

## 2019-08-01 LAB — HEMOGLOBIN A1C
Hgb A1c MFr Bld: 5.3 % (ref 4.8–5.6)
Mean Plasma Glucose: 105.41 mg/dL

## 2019-08-01 LAB — TSH: TSH: 1.138 u[IU]/mL (ref 0.350–4.500)

## 2019-08-01 MED ORDER — BENZTROPINE MESYLATE 1 MG PO TABS
1.0000 mg | ORAL_TABLET | Freq: Two times a day (BID) | ORAL | Status: DC
  Administered 2019-08-01 – 2019-08-02 (×3): 1 mg via ORAL
  Filled 2019-08-01 (×8): qty 1

## 2019-08-01 MED ORDER — TEMAZEPAM 30 MG PO CAPS
30.0000 mg | ORAL_CAPSULE | Freq: Every day | ORAL | Status: DC
  Administered 2019-08-01: 21:00:00 30 mg via ORAL
  Filled 2019-08-01: qty 1

## 2019-08-01 MED ORDER — RISPERIDONE 3 MG PO TABS
3.0000 mg | ORAL_TABLET | Freq: Two times a day (BID) | ORAL | Status: DC
  Administered 2019-08-01 – 2019-08-02 (×3): 3 mg via ORAL
  Filled 2019-08-01: qty 1
  Filled 2019-08-01: qty 14
  Filled 2019-08-01 (×7): qty 1

## 2019-08-01 NOTE — Progress Notes (Signed)
   08/01/19 2200  Psych Admission Type (Psych Patients Only)  Admission Status Voluntary  Psychosocial Assessment  Patient Complaints Anxiety  Eye Contact Fair  Facial Expression Anxious  Affect Anxious  Speech Soft;Logical/coherent  Interaction Assertive  Motor Activity Slow  Appearance/Hygiene Unremarkable  Behavior Characteristics Cooperative  Mood Pleasant  Aggressive Behavior  Effect No apparent injury  Thought Process  Coherency WDL  Content WDL  Delusions WDL  Perception WDL  Hallucination None reported or observed  Judgment Poor  Confusion WDL  Danger to Self  Current suicidal ideation? Denies  Danger to Others  Danger to Others None reported or observed   Pt visibly paranoid on the unit. Pt stated she was ready to go home, pt continues to talk about last admission here and Probation officer. Pt stated she was going to behave and take her medication and get some rest, even after she D/C.

## 2019-08-01 NOTE — Progress Notes (Signed)
Recreation Therapy Notes  Date: 12.24.20 Time: 0950 Location: 500 Hall Dayroom  Group Topic: Communication, Team Building, Problem Solving  Goal Area(s) Addresses:  Patient will effectively work with peer towards shared goal.  Patient will identify skill used to make activity successful.  Patient will identify how skills used during activity can be used to reach post d/c goals.   Behavioral Response: Engaged  Intervention: STEM Activity   Activity: [employer]. Patients were provided the following materials: 5 drinking straws, 5 rubber bands, 5 paper clips, 2 index cards, and 2 drinking cups. Using the provided materials patients were asked to build a launching mechanisms to launch a ping pong ball approximately 12 feet. Patients were divided into teams of 3-5.   Education: Education officer, community, Dentist.   Education Outcome: Acknowledges education/In group clarification offered/Needs additional education.   Clinical Observations/Feedback: Pt came to group late but got right involved with the activity.  Pt was bright and pleasant.  Pt worked on her own to complete activity.  Pt was also social with peers.   Victorino Sparrow, LRT/CTRS    Victorino Sparrow A 08/01/2019 10:52 AM

## 2019-08-01 NOTE — BHH Suicide Risk Assessment (Signed)
[facility] Admission Suicide Risk Assessment   Nursing information obtained from:  Patient, Other (Comment)(GPD; IVD'd) Demographic factors:  NA Current Mental Status:  Self-harm thoughts Loss Factors:  NA Historical Factors:  NA Risk Reduction Factors:  Living with another person, especially a relative  Total Time spent with patient: 45 minutes Principal Problem: New onset mania/psychosis Diagnosis:  Active Problems:   Schizophrenia ([place])   Bipolar I disorder, current or most recent episode manic, with psychotic features ([place])  Subjective Data: 2 presentations within 24 hours to the emergency department for disorganized behavior/mania/psychosis in need of inpatient stabilization drug screen showing cannabis reports recreational use  Continued Clinical Symptoms:    The "Alcohol Use Disorders Identification Test", Guidelines for Use in Primary Care, Second Edition.  World Pharmacologist [facility]). Score between 0-7:  no or low risk or alcohol related problems. Score between 8-15:  moderate risk of alcohol related problems. Score between 16-19:  high risk of alcohol related problems. Score 20 or above:  warrants further diagnostic evaluation for alcohol dependence and treatment.   CLINICAL FACTORS:   Bipolar Disorder:   Mixed State   Musculoskeletal: Strength & Muscle Tone: within normal limits Gait & Station: normal Patient leans: N/A  Psychiatric Specialty Exam: Physical Exam  Nursing note and vitals reviewed. Constitutional: She appears well-developed and well-nourished.  Cardiovascular: Normal rate and regular rhythm.    Review of Systems  Constitutional: Positive for appetite change.  Eyes: Negative.   Respiratory: Negative.   Cardiovascular: Negative.   Endocrine: Negative.   Genitourinary: Negative.   Musculoskeletal: Negative.   Neurological: Negative.   Hematological: Negative.     Blood pressure (!) 149/110, pulse (!) 117, temperature 98.4 F (36.9 C),  temperature source Oral, resp. rate 16, height 5\' 1"  (1.549 m), weight 113.4 kg, SpO2 100 %.Body mass index is 47.24 kg/m.  General Appearance: Disheveled  Eye Contact:  Good  Speech:  Clear and Coherent  Volume:  Normal  Mood:  Anxious  Affect:  Congruent  Thought Process:  Linear and Descriptions of Associations: Circumstantial  Orientation:  Full (Time, Place, and Person)  Thought Content:  Denies any previously expressed delusional material but again is focused on discharge so minimizing all previous symptoms but denies hallucinations  Suicidal Thoughts:  No  Homicidal Thoughts:  No  Memory:  Immediate;   Fair Recent;   Good Remote;   Fair  Judgement:  Fair  Insight:  Fair  Psychomotor Activity:  Normal  Concentration:  Concentration: Fair and Attention Span: Fair  Recall:  [employer] of Knowledge:  Good  Language: Normal  Akathisia:  Negative  Handed:  Right  AIMS (if indicated):     Assets:  Communication Skills Desire for Improvement  ADL's:  Intact  Cognition:  WNL  Sleep:  Number of Hours: 4   COGNITIVE FEATURES THAT CONTRIBUTE TO RISK:  Polarized thinking    SUICIDE RISK:   Minimal: No identifiable suicidal ideation.  Patients presenting with no risk factors but with morbid ruminations; may be classified as minimal risk based on the severity of the depressive symptoms  PLAN OF CARE: Needs inpatient stabilization though lobbies for discharge  I certify that inpatient services furnished can reasonably be expected to improve the patient's condition.   Johnn Hai, MD 08/01/2019, 9:25 AM

## 2019-08-01 NOTE — BHH Suicide Risk Assessment (Signed)
[place] INPATIENT:  Family/Significant Other Suicide Prevention Education  Suicide Prevention Education:  Contact Attempts: Pt's fiance, Savannah [name], has been identified by the patient as the family member/significant other with whom the patient will be residing, and identified as the person(s) who will aid the patient in the event of a mental health crisis.  With written consent from the patient, two attempts were made to provide suicide prevention education, prior to and/or following the patient's discharge.  We were unsuccessful in providing suicide prevention education.  A suicide education pamphlet was given to the patient to share with family/significant other.  Date and time of first attempt: 12/24 @ 2:35pm Date and time of second attempt:  Savannah [name] 08/01/2019, 2:35 PM
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
## 2019-08-02 LAB — PROLACTIN: Prolactin: 34.9 ng/mL — ABNORMAL HIGH (ref 4.8–23.3)

## 2019-08-02 MED ORDER — TEMAZEPAM 30 MG PO CAPS: 30.0000 mg | ORAL_CAPSULE | Freq: Every day | ORAL | 0 refills | Status: DC

## 2019-08-02 MED ORDER — BENZTROPINE MESYLATE 1 MG PO TABS: 1.0000 mg | ORAL_TABLET | Freq: Two times a day (BID) | ORAL | 2 refills | Status: DC

## 2019-08-02 MED ORDER — RISPERIDONE 3 MG PO TABS: 3.0000 mg | ORAL_TABLET | Freq: Two times a day (BID) | ORAL | 2 refills | Status: DC

## 2019-08-02 NOTE — Progress Notes (Signed)
   08/02/19 0800  Psych Admission Type (Psych Patients Only)  Admission Status Voluntary  Psychosocial Assessment  Patient Complaints Anxiety  Eye Contact Fair  Facial Expression Anxious  Affect Anxious  Speech Soft;Logical/coherent  Interaction Assertive  Motor Activity Slow  Appearance/Hygiene Unremarkable  Behavior Characteristics Cooperative  Mood Pleasant  Aggressive Behavior  Effect No apparent injury  Thought Process  Coherency WDL  Content WDL  Delusions WDL  Perception WDL  Hallucination None reported or observed  Judgment Poor  Confusion WDL  Danger to Self  Current suicidal ideation? Denies  Danger to Others  Danger to Others None reported or observed

## 2019-08-02 NOTE — Progress Notes (Signed)
  [facility] Adult Case Management Discharge Plan :  Will you be returning to the same living situation after discharge:  Yes,  home with fiance At discharge, do you have transportation home?: Yes,  pt's fiance Do you have the ability to pay for your medications: No.; [facility]  Release of information consent forms completed and in the chart;  Patient's signature needed at discharge.  Patient to Follow up at: Follow-up Information    [facility] Follow up.   Why: Your social worker will contact you on Monday, 12/28 to inform you of your appointment with [facility].  Contact information: [address] [place] [address] [phone]           Next level of care provider has access to [place] and Suicide Prevention discussed: Yes,  pt's fiance     Has patient been referred to the Quitline?: N/A patient is not a smoker  Patient has been referred for addiction treatment: Yes  Trecia Rogers, LCSW 08/02/2019, 11:26 AM
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
## 2019-08-02 NOTE — Progress Notes (Signed)
Pt given her pony tail and grease from her locker.

## 2019-08-02 NOTE — Progress Notes (Signed)
Recreation Therapy Notes  Date: 12.25.20 Time: 1020-1140 Location: [address] Dayroom  Group Topic: Self-Expression  Goal Area(s) Addresses:  Patient will successfully identify positive attributes about themselves.  Patient will successfully identify benefit of improved self-esteem.  Behavioral Response:  Engaged   Intervention: Statistician, Music  Activity: Christmas Art.  Patients had the option of making Christmas card or any festive Christmas art of their choosing.  Education:  Self-Esteem, Dentist.   Education Outcome: Acknowledges education/In group clarification offered/Needs additional education  Clinical Observations/Feedback: Pt was able to decorate her folder and paint a separate picture.  Pt was fully engaged and pleasant during activity.     Victorino Sparrow, LRT/CTRS         Victorino Sparrow A 08/02/2019 12:30 PM

## 2019-08-02 NOTE — Progress Notes (Signed)
Discharge note:  D:  Pt verbalizes readiness for discharge and denies SI/HI/AVH.   A: Discharge instructions reviewed with patient, belongings returned, and prescriptions were given as applicable.    R: Pt  verbalized understanding of d/c instructions and stated her intent to be compliant with them.  Pt discharged to lobby without incident.

## 2019-08-02 NOTE — Plan of Care (Signed)
Pt was able to focus on task with little prompting from staff at completion of recreation therapy group sessions.   Victorino Sparrow, LRT/CTRS

## 2019-08-02 NOTE — Tx Team (Signed)
Interdisciplinary Treatment and Diagnostic Plan Update  08/02/2019 Time of Session: 10:50am Savannah [name] MRN: [id number]  Principal Diagnosis: <principal problem not specified>  Secondary Diagnoses: Active Problems:   Schizophrenia ([place])   Bipolar I disorder, current or most recent episode manic, with psychotic features ([place])   Current Medications:  Current Facility-Administered Medications  Medication Dose Route Frequency Provider Last Rate Last Admin  . benztropine (COGENTIN) tablet 1 mg  1 mg Oral BID Johnn Hai, MD   1 mg at 08/02/19 0744  . hydrOXYzine (ATARAX/VISTARIL) tablet 25 mg  25 mg Oral TID PRN Anike, Adaku C, NP   25 mg at 07/31/19 2104  . risperiDONE (RISPERDAL) tablet 3 mg  3 mg Oral BID Johnn Hai, MD   3 mg at 08/02/19 0744  . temazepam (RESTORIL) capsule 30 mg  30 mg Oral QHS Johnn Hai, MD   30 mg at 08/01/19 2118  . traZODone (DESYREL) tablet 50 mg  50 mg Oral QHS PRN Anike, Adaku C, NP   50 mg at 07/31/19 2104  . ziprasidone (GEODON) injection 20 mg  20 mg Intramuscular Q12H PRN Mordecai Maes, NP   20 mg at 07/31/19 1415   PTA Medications: Medications Prior to Admission  Medication Sig Dispense Refill Last Dose  . acetaminophen (TYLENOL) 325 MG tablet Take 650 mg by mouth every 6 (six) hours as needed for mild pain or headache.     . albuterol (VENTOLIN HFA) 108 (90 Base) MCG/ACT inhaler Inhale 1-2 puffs into the lungs every 6 (six) hours as needed for wheezing or shortness of breath. 18 g 0   . amoxicillin (AMOXIL) 875 MG tablet Take 1 tablet (875 mg total) by mouth 2 (two) times daily. (Patient not taking: Reported on 07/30/2019) 14 tablet 0   . benzonatate (TESSALON) 200 MG capsule Take 1 capsule (200 mg total) by mouth 2 (two) times daily as needed for cough. (Patient not taking: Reported on 07/30/2019) 20 capsule 0     Patient Stressors: Medication change or noncompliance Occupational concerns  Patient Strengths: Capable of independent  living Special hobby/interest Supportive family/friends  Treatment Modalities: Medication Management, Group therapy, Case management,  1 to 1 session with clinician, Psychoeducation, Recreational therapy.   Physician Treatment Plan for Primary Diagnosis: <principal problem not specified> Long Term Goal(s): Improvement in symptoms so as ready for discharge Improvement in symptoms so as ready for discharge   Short Term Goals: Ability to identify changes in lifestyle to reduce recurrence of condition will improve Ability to verbalize feelings will improve Ability to disclose and discuss suicidal ideas Ability to demonstrate self-control will improve Ability to identify and develop effective coping behaviors will improve Ability to maintain clinical measurements within normal limits will improve Compliance with prescribed medications will improve Ability to identify triggers associated with substance abuse/mental health issues will improve  Medication Management: Evaluate patient's response, side effects, and tolerance of medication regimen.  Therapeutic Interventions: 1 to 1 sessions, Unit Group sessions and Medication administration.  Evaluation of Outcomes: Adequate for Discharge  Physician Treatment Plan for Secondary Diagnosis: Active Problems:   Schizophrenia ([place])   Bipolar I disorder, current or most recent episode manic, with psychotic features ([place])  Long Term Goal(s): Improvement in symptoms so as ready for discharge Improvement in symptoms so as ready for discharge   Short Term Goals: Ability to identify changes in lifestyle to reduce recurrence of condition will improve Ability to verbalize feelings will improve Ability to disclose and discuss suicidal ideas Ability to  demonstrate self-control will improve Ability to identify and develop effective coping behaviors will improve Ability to maintain clinical measurements within normal limits will improve Compliance with  prescribed medications will improve Ability to identify triggers associated with substance abuse/mental health issues will improve     Medication Management: Evaluate patient's response, side effects, and tolerance of medication regimen.  Therapeutic Interventions: 1 to 1 sessions, Unit Group sessions and Medication administration.  Evaluation of Outcomes: Adequate for Discharge   RN Treatment Plan for Primary Diagnosis: <principal problem not specified> Long Term Goal(s): Knowledge of disease and therapeutic regimen to maintain health will improve  Short Term Goals: Ability to participate in decision making will improve, Ability to verbalize feelings will improve, Ability to disclose and discuss suicidal ideas, Ability to identify and develop effective coping behaviors will improve and Compliance with prescribed medications will improve  Medication Management: RN will administer medications as ordered by provider, will assess and evaluate patient's response and provide education to patient for prescribed medication. RN will report any adverse and/or side effects to prescribing provider.  Therapeutic Interventions: 1 on 1 counseling sessions, Psychoeducation, Medication administration, Evaluate responses to treatment, Monitor vital signs and CBGs as ordered, Perform/monitor CIWA, COWS, AIMS and Fall Risk screenings as ordered, Perform wound care treatments as ordered.  Evaluation of Outcomes: Adequate for Discharge   LCSW Treatment Plan for Primary Diagnosis: <principal problem not specified> Long Term Goal(s): Safe transition to appropriate next level of care at discharge, Engage patient in therapeutic group addressing interpersonal concerns.  Short Term Goals: Engage patient in aftercare planning with referrals and resources and Increase skills for wellness and recovery  Therapeutic Interventions: Assess for all discharge needs, 1 to 1 time with Social worker, Explore available resources  and support systems, Assess for adequacy in community support network, Educate family and significant other(s) on suicide prevention, Complete Psychosocial Assessment, Interpersonal group therapy.  Evaluation of Outcomes: Adequate for Discharge   Progress in Treatment: Attending groups: Yes. Participating in groups: Yes. Taking medication as prescribed: Yes. Toleration medication: Yes. Family/Significant other contact made: Yes, individual(s) contacted:  pt's fiance Patient understands diagnosis: Yes. Discussing patient identified problems/goals with staff: Yes. Medical problems stabilized or resolved: Yes. Denies suicidal/homicidal ideation: Yes. Issues/concerns per patient self-inventory: No. Other:   New problem(s) identified: No, Describe:  None  New Short Term/Long Term Goal(s): Medication stabilization, elimination of SI thoughts, and development of a comprehensive mental wellness plan.   Patient Goals:    Discharge Plan or Barriers: Patient will be discharging home with her fiance. Patient will be [facility] with [facility] for aftercare services.   Reason for Continuation of Hospitalization: Patient is discharging today.   Estimated Length of Stay: Patient is discharging today.   Attendees: Patient: 08/02/2019 11:57 AM  Physician: Dr. Malvin Johns, MD  08/02/2019 11:57 AM  Nursing: Will, RN  08/02/2019 11:57 AM  RN Care Manager: 08/02/2019 11:57 AM  Social Worker: Stephannie Peters, LCSW  08/02/2019 11:57 AM  Recreational Therapist:  08/02/2019 11:57 AM  Other:  08/02/2019 11:57 AM  Other:  08/02/2019 11:57 AM  Other: 08/02/2019 11:57 AM    Scribe for Treatment Team: Delphia Grates, LCSW 08/02/2019 11:57 AM

## 2019-08-02 NOTE — BHH Suicide Risk Assessment (Signed)
Savannah [name] INPATIENT:  Family/Significant Other Suicide Prevention Education  Suicide Prevention Education:  Education Completed; Pt's fiance, Savannah [name],  has been identified by the patient as the family member/significant other with whom the patient will be residing, and identified as the person(s) who will aid the patient in the event of a mental health crisis (suicidal ideations/suicide attempt).  With written consent from the patient, the family member/significant other has been provided the following suicide prevention education, prior to the and/or following the discharge of the patient.  The suicide prevention education provided includes the following:  Suicide risk factors  Suicide prevention and interventions  National Suicide Hotline telephone number  [facility] assessment telephone number  [facility] Emergency Assistance [place] and/or Residential Mobile Crisis Unit telephone number  Request made of family/significant other to:  Remove weapons (e.g., guns, rifles, knives), all items previously/currently identified as safety concern.    Remove drugs/medications (over-the-counter, prescriptions, illicit drugs), all items previously/currently identified as a safety concern.  The family member/significant other verbalizes understanding of the suicide prevention education information provided.  The family member/significant other agrees to remove the items of safety concern listed above.   CSW contacted pt's fiance, Savannah [name]. Pt's husband stated that he feels the patient is doing much better and he is happy she is being discharged. Patient's fiance stated he will be at the hospital to pick her up at 12:30pm  Trecia Rogers 08/02/2019, 11:20 AM
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
## 2019-08-17 ENCOUNTER — Encounter (HOSPITAL_COMMUNITY): Payer: Self-pay

## 2019-08-17 ENCOUNTER — Ambulatory Visit (HOSPITAL_COMMUNITY)
Admission: RE | Admit: 2019-08-17 | Discharge: 2019-08-17 | Disposition: A | Discharge status: Home / Self Care | Payer: Federal, State, Local not specified - Other | Attending: Psychiatry | Admitting: Psychiatry

## 2019-08-17 ENCOUNTER — Other Ambulatory Visit: Payer: Self-pay

## 2019-08-17 ENCOUNTER — Emergency Department (HOSPITAL_COMMUNITY)
Admission: EM | Admit: 2019-08-17 | Discharge: 2019-08-17 | Disposition: A | Discharge status: Home / Self Care | Payer: Self-pay | Attending: Emergency Medicine | Admitting: Emergency Medicine

## 2019-08-17 DIAGNOSIS — Z5321 Procedure and treatment not carried out due to patient leaving prior to being seen by health care provider: Secondary | ICD-10-CM | POA: Insufficient documentation

## 2019-08-17 DIAGNOSIS — F419 Anxiety disorder, unspecified: Secondary | ICD-10-CM | POA: Insufficient documentation

## 2019-08-17 LAB — RAPID URINE DRUG SCREEN, HOSP PERFORMED
Amphetamines: NOT DETECTED
Barbiturates: NOT DETECTED
Benzodiazepines: POSITIVE — AB
Cocaine: NOT DETECTED
Opiates: NOT DETECTED
Tetrahydrocannabinol: POSITIVE — AB

## 2019-08-17 LAB — URINALYSIS, ROUTINE W REFLEX MICROSCOPIC
Bilirubin Urine: NEGATIVE
Glucose, UA: NEGATIVE mg/dL
Hgb urine dipstick: NEGATIVE
Ketones, ur: 80 mg/dL — AB
Leukocytes,Ua: NEGATIVE
Nitrite: NEGATIVE
Protein, ur: 100 mg/dL — AB
Specific Gravity, Urine: 1.026 (ref 1.005–1.030)
pH: 5 (ref 5.0–8.0)

## 2019-08-17 LAB — PREGNANCY, URINE: Preg Test, Ur: NEGATIVE

## 2019-08-17 NOTE — ED Notes (Signed)
Pt stated that since she has given a urine sample she is leaving. Pt informed that a provider has not seen her yet, is she leaves it is against medical advice. Pt states she has a right to leave and staff cannot stop her. Pt eloped. Army Melia, PA aware.

## 2019-08-17 NOTE — ED Triage Notes (Signed)
Pt requesting pregnancy test. Pt currently anxious and having difficulty answering questions with RN. Pt restless and difficult to redirect. Pt states she has no other complaints than wanting a pregnancy test.

## 2019-08-17 NOTE — B[facility] Counselor (Signed)
The Patient presented to [facility] and noted on completed registration sheet she was experiencing "guilt", "irritability/anger", and "chest pain."  When the Patient was asked to walk to the assessment room, she became irritated.  Patient reports she was only here for "medication" to tide her over until she is seen by [facility] on Monday, 08/19/2019, because "I haven't been able to sleep."    Assessment procedures were explained to the Patient and that medication would not be prescribed to bridge her until she is seen by [facility].  The Patient then stated she did not want to be hospitalized, because last time she was admitted to [facility] in 07-2019, it was involuntary.  The Patient told she will not be held here against her will and that she could leave if that is her decision.  The Patient elected to leave and her personal property was returned and she and a female companion left.

## 2019-08-25 ENCOUNTER — Other Ambulatory Visit: Payer: Self-pay

## 2019-08-25 ENCOUNTER — Observation Stay (HOSPITAL_COMMUNITY)
Admission: AD | Admit: 2019-08-25 | Discharge: 2019-08-26 | Disposition: A | Discharge status: Home / Self Care | Payer: Federal, State, Local not specified - Other | Attending: Psychiatry | Admitting: Psychiatry

## 2019-08-25 DIAGNOSIS — F419 Anxiety disorder, unspecified: Secondary | ICD-10-CM | POA: Insufficient documentation

## 2019-08-25 DIAGNOSIS — Z9114 Patient's other noncompliance with medication regimen: Principal | ICD-10-CM | POA: Insufficient documentation

## 2019-08-25 DIAGNOSIS — F209 Schizophrenia, unspecified: Secondary | ICD-10-CM | POA: Diagnosis present

## 2019-08-25 DIAGNOSIS — Z20822 Contact with and (suspected) exposure to covid-19: Secondary | ICD-10-CM | POA: Insufficient documentation

## 2019-08-25 DIAGNOSIS — F329 Major depressive disorder, single episode, unspecified: Secondary | ICD-10-CM | POA: Insufficient documentation

## 2019-08-25 LAB — RESPIRATORY PANEL BY RT PCR (FLU A&B, COVID)
Influenza A by PCR: NEGATIVE
Influenza B by PCR: NEGATIVE
SARS Coronavirus 2 by RT PCR: NEGATIVE

## 2019-08-25 MED ORDER — ALUM & MAG HYDROXIDE-SIMETH 200-200-20 MG/5ML PO SUSP: 30.0000 mL | ORAL | Status: DC | PRN

## 2019-08-25 MED ORDER — LORAZEPAM 1 MG PO TABS
2.0000 mg | ORAL_TABLET | Freq: Four times a day (QID) | ORAL | Status: DC | PRN
  Filled 2019-08-25: qty 2

## 2019-08-25 MED ORDER — LORAZEPAM 2 MG/ML IJ SOLN
2.0000 mg | Freq: Four times a day (QID) | INTRAMUSCULAR | Status: DC | PRN
  Administered 2019-08-25: 11:00:00 2 mg via INTRAMUSCULAR
  Filled 2019-08-25: qty 1

## 2019-08-25 MED ORDER — RISPERIDONE 3 MG PO TABS
3.0000 mg | ORAL_TABLET | Freq: Two times a day (BID) | ORAL | Status: DC
  Filled 2019-08-25 (×3): qty 1

## 2019-08-25 MED ORDER — HYDROXYZINE HCL 25 MG PO TABS: 25.0000 mg | ORAL_TABLET | Freq: Three times a day (TID) | ORAL | Status: DC | PRN

## 2019-08-25 MED ORDER — MAGNESIUM HYDROXIDE 400 MG/5ML PO SUSP: 30.0000 mL | Freq: Every day | ORAL | Status: DC | PRN

## 2019-08-25 MED ORDER — TRAZODONE HCL 50 MG PO TABS: 50.0000 mg | ORAL_TABLET | Freq: Every evening | ORAL | Status: DC | PRN

## 2019-08-25 MED ORDER — ACETAMINOPHEN 325 MG PO TABS: 650.0000 mg | ORAL_TABLET | Freq: Four times a day (QID) | ORAL | Status: DC | PRN

## 2019-08-25 NOTE — Progress Notes (Signed)
Patient ID: Savannah [name], female   DOB: August 30, 1985, 34 y.o.   MRN: [id number] DAR Note: Pt alert at start of shift, but reported being drowsy, denied SI/HI/AVH, food offered, but she stated she ate while on the [address], fluids given, pt encouraged to lie down and get some rest as she was observed to be staggering while ambulating.  Pt is currently being observed on Q15 minute checks, will continue to monitor.
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
## 2019-08-25 NOTE — H&P (Signed)
BH Observation Unit Provider Admission PAA/H&P  Patient Identification: Savannah [name] MRN:  [id number] Date of Evaluation:  08/25/2019 Chief Complaint:  Anxiety Depression Principal Diagnosis: <principal problem not specified> Diagnosis:  Active Problems:   * No active hospital problems. *  History of Present Illness: Patient present with fiac'e as a walk-in  to [facility]. Reporting patient hasn't been taken medication as directed and missed her follow-up appointment with [facility]. Stated she is disorganized and.  Patient reports auditory hallucinations, worthlessness and worsening anxiety.  Patient has agreed to sign in voluntary. Patient was recently discharged from inpatient admission due to scattered and disorganized thinking.  Patient presents irritable and manic during this assessment.  Per TTS assessment: Savannah [name] is an 34 y.o. female. Pt was brought in by [name]. Per [name] the Pt is having auditory hallucinations and does not have her medications for Schizophrenia. The Pt was irritated and did not want to participate in the assessment. The Pt would only inform this writer that she was "tired of this Schizophrenia stuff." Per [name] the Pt is seen at [facility] but the Pt cannot get an appointment for medication management. [name] states that the Pt is disorganized and manic. [name] states that the Pt is not suicidal or homicidal but she is destroying his home. The Pt was hospitalized in December 2020 at [facility].  Associated Signs/Symptoms: Depression Symptoms:  feelings of worthlessness/guilt, difficulty concentrating, anxiety, (Hypo) Manic Symptoms:  Flight of Ideas, Irritable Mood, Anxiety Symptoms:  Excessive Worry, Social Anxiety, Psychotic Symptoms:  Hallucinations: Auditory PTSD Symptoms: NA Total Time spent with patient: 15 minutes  Past Psychiatric History:  Is the patient at risk to self? Yes.    Has the patient been a risk to self in the  past 6 months? Yes.    Has the patient been a risk to self within the distant past? No.  Is the patient a risk to others? No.  Has the patient been a risk to others in the past 6 months? No.  Has the patient been a risk to others within the distant past? No.   Prior Inpatient Therapy:   Prior Outpatient Therapy:    Alcohol Screening:   Substance Abuse History in the last 12 months:  No. Consequences of Substance Abuse: NA Previous Psychotropic Medications: No  Psychological Evaluations: No  Past Medical History:  Past Medical History:  Diagnosis Date  . Depression   . Kidney infection    No past surgical history on file. Family History:  Family History  Problem Relation Age of Onset  . Diabetes Other   . Hypertension Other   . Stroke Other    Family Psychiatric History:  Tobacco Screening:   Social History:  Social History   Substance and Sexual Activity  Alcohol Use Yes   Comment: Occasional Drinker     Social History   Substance and Sexual Activity  Drug Use Yes  . Types: Marijuana    Additional Social History:                           Allergies:   Allergies  Allergen Reactions  . Sudafed [Pseudoephedrine] Swelling    Swelling    Lab Results: No results found for this or any previous visit (from the past 48 hour(s)).  Blood Alcohol level:  Lab Results  Component Value Date   ETH <10 07/30/2019   ETH <11 09/27/2013  Metabolic Disorder Labs:  Lab Results  Component Value Date   HGBA1C 5.3 08/01/2019   MPG 105.41 08/01/2019   Lab Results  Component Value Date   PROLACTIN 34.9 (H) 08/01/2019   Lab Results  Component Value Date   CHOL 118 08/01/2019   TRIG 52 08/01/2019   HDL 33 (L) 08/01/2019   CHOLHDL 3.6 08/01/2019   VLDL 10 08/01/2019   LDLCALC 75 08/01/2019    Current Medications: No current facility-administered medications for this encounter.   Current Outpatient Medications  Medication Sig Dispense Refill  .  albuterol (VENTOLIN HFA) 108 (90 Base) MCG/ACT inhaler Inhale 1-2 puffs into the lungs every 6 (six) hours as needed for wheezing or shortness of breath. 18 g 0  . benzonatate (TESSALON) 200 MG capsule Take 1 capsule (200 mg total) by mouth 2 (two) times daily as needed for cough. (Patient not taking: Reported on 07/30/2019) 20 capsule 0  . benztropine (COGENTIN) 1 MG tablet Take 1 tablet (1 mg total) by mouth 2 (two) times daily. 60 tablet 2  . risperiDONE (RISPERDAL) 3 MG tablet Take 1 tablet (3 mg total) by mouth 2 (two) times daily. 60 tablet 2  . temazepam (RESTORIL) 30 MG capsule Take 1 capsule (30 mg total) by mouth at bedtime. 30 capsule 0   PTA Medications: No medications prior to admission.    Musculoskeletal: Strength & Muscle Tone: within normal limits Gait & Station: normal Patient leans: N/A  Psychiatric Specialty Exam: Physical Exam  Vitals reviewed. Constitutional: She appears well-developed.  Skin: Skin is warm and dry.  Psychiatric: She has a normal mood and affect. Her behavior is normal.    Review of Systems  Psychiatric/Behavioral: Positive for behavioral problems and hallucinations. The patient is nervous/anxious.   All other systems reviewed and are negative.   Blood pressure 131/90, pulse 100, temperature 98.8 F (37.1 C), temperature source Oral, resp. rate 18, SpO2 100 %.There is no height or weight on file to calculate BMI.  General Appearance: Disheveled  Eye Contact:  Good  Speech:  Clear and Coherent  Volume:  Normal  Mood:  Anxious, Depressed and Irritable  Affect:  Labile  Thought Process:  Coherent  Orientation:  Full (Time, Place, and Person)  Thought Content:  Hallucinations: Auditory  Suicidal Thoughts:  No  Homicidal Thoughts:  No  Memory:  Immediate;   Fair Recent;   Fair  Judgement:  Fair  Insight:  Fair  Psychomotor Activity:  Restlessness  Concentration:  Concentration: Fair  Recall:  [employer] of Knowledge:  Fair  Language:   Fair  Akathisia:  No  Handed:  Right  AIMS (if indicated):     Assets:  Communication Skills Desire for Improvement Physical Health Resilience  ADL's:  Intact  Cognition:  WNL  Sleep:      Treatment Plan Summary: Daily contact with patient to assess and evaluate symptoms and progress in treatment and Medication management   Inpatient admission recommendation  Restarted Risperdal 3 mg po BID Continue Vistaril 25 mg po BID   Observation Level/Precautions:  15 minute checks Laboratory:  CBC Chemistry Profile Psychotherapy:  Individual and group session Medications: restarted home medications  Consultations: CSw and psychiatry   Discharge Concerns:  Safety, stabilization, and risk of access to medication and medication stabilization  Estimated LOS: 5-7 days Other:      Derrill Center, NP 1/17/20219:55 AM

## 2019-08-25 NOTE — Plan of Care (Signed)
BHH Observation Crisis Plan  Reason for Crisis Plan:  Chronic Mental Illness/Medical Illness, Crisis Stabilization and Medication Management   Plan of Care:  Referral for Inpatient Hospitalization and Referral for Telepsychiatry/Psychiatric Consult  Family Support:    Fiance & mother  Current Living Environment:  Living Arrangements: Spouse/significant other  Insurance:   Hospital Account    Name Acct ID Class Status Primary Coverage   Savannah [name], Savannah [name] [id number] BEHAVIORAL HEALTH OBSERVATION [facility] FOR MH/DD/SAS - 3-WAY [place]        Guarantor Account (for Hospital Account [id number])    Name Relation to Pt Service Area Active? Acct Type   Savannah [name] Self [facility] Yes Behavioral Health   Address Phone       [address] [place], [place] [address] [phone](H)          Coverage Information (for Hospital Account [id number])    F/O Payor/Plan Precert #   [facility] FOR MH/DD/SAS/3-WAY [facility]    Subscriber Subscriber #   Savannah [name], Savannah [name] [id number]   Address Phone   [address] [place], [place] [address] [phone]      Legal Guardian:  Legal Guardian: Other:(self)  Primary Care Provider:  Patient, No Pcp Per  Current Outpatient Providers:  [facility]  Psychiatrist: "I go to [employer]" Counselor/Therapist:  Name of Therapist: none  Compliant with Medications:  Yes  Additional Information: Per pt's fiance "I have to help her take her medicines and go to the appointments with her but I work all the time too".   Sherryl Manges 1/17/202112:10 PM

## 2019-08-25 NOTE — Progress Notes (Signed)
Pt presents to [facility] Observation as a walk in accompany by "my fiancee". Pt appeared anxious / restless with crying spells, logical but pressured, loud and rapid speech. She is defensive, argumentative, verbally abusive and easily irritated on interactions. Pt denies SI,HI,AVH and pain when asked.Per pt's finace ([name])  Pt is "She's Schizophrenic, she has not been sleeping, she's not eating, she ran out of her medications. She's been hearing voices and talking to them. I work everyday, I was not able to take her to [facility] for her appointment on January 11th to get her medications; so she ran out of her medications. She does not have patience to stay on the phone with [facility]". He reports that pt is easily agitated and destroy items at home. Pt is uncooperative with care at this time. Decline to have a Covid test done per routine admission process. Skin assessment completed and belongings secured. Multiple tattoos noted all over pt's body (arms, L. Thigh, chest) but no areas of breakdown noted at this time. Emotional support offered to pt. Encouraged pt to voice concerns. Q 15 minutes safety checks initiated and pt is monitored as such without self harm gestures.

## 2019-08-26 DIAGNOSIS — F200 Paranoid schizophrenia: Secondary | ICD-10-CM

## 2019-08-26 LAB — COMPREHENSIVE METABOLIC PANEL
ALT: 16 U/L (ref 0–44)
AST: 18 U/L (ref 15–41)
Albumin: 3.5 g/dL (ref 3.5–5.0)
Alkaline Phosphatase: 54 U/L (ref 38–126)
Anion gap: 6 (ref 5–15)
BUN: 9 mg/dL (ref 6–20)
CO2: 25 mmol/L (ref 22–32)
Calcium: 8.9 mg/dL (ref 8.9–10.3)
Chloride: 108 mmol/L (ref 98–111)
Creatinine, Ser: 0.63 mg/dL (ref 0.44–1.00)
GFR calc Af Amer: >60 mL/min (ref >60)
GFR calc non Af Amer: >60 mL/min (ref >60)
Glucose, Bld: 73 mg/dL (ref 70–99)
Potassium: 3.6 mmol/L (ref 3.5–5.1)
Sodium: 139 mmol/L (ref 135–145)
Total Bilirubin: 0.9 mg/dL (ref 0.3–1.2)
Total Protein: 6.4 g/dL — ABNORMAL LOW (ref 6.5–8.1)

## 2019-08-26 LAB — CBC
HCT: 42.1 % (ref 36.0–46.0)
Hemoglobin: 12.9 g/dL (ref 12.0–15.0)
MCH: 25.6 pg — ABNORMAL LOW (ref 26.0–34.0)
MCHC: 30.6 g/dL (ref 30.0–36.0)
MCV: 83.7 fL (ref 80.0–100.0)
Platelets: 226 10*3/uL (ref 150–400)
RBC: 5.03 MIL/uL (ref 3.87–5.11)
RDW: 17.1 % — ABNORMAL HIGH (ref 11.5–15.5)
WBC: 5.5 10*3/uL (ref 4.0–10.5)
nRBC: 0 % (ref 0.0–0.2)

## 2019-08-26 MED ORDER — FLUPHENAZINE HCL 10 MG PO TABS: 10.0000 mg | ORAL_TABLET | Freq: Two times a day (BID) | ORAL | 1 refills | Status: DC

## 2019-08-26 MED ORDER — FLUPHENAZINE HCL 5 MG PO TABS
2.5000 mg | ORAL_TABLET | Freq: Two times a day (BID) | ORAL | Status: DC
  Administered 2019-08-26: 2.5 mg via ORAL
  Filled 2019-08-26: qty 1

## 2019-08-26 MED ORDER — RISPERIDONE 2 MG PO TABS: 2.0000 mg | ORAL_TABLET | Freq: Two times a day (BID) | ORAL | Status: DC

## 2019-08-26 MED ORDER — BENZTROPINE MESYLATE 1 MG PO TABS: 1.0000 mg | ORAL_TABLET | Freq: Two times a day (BID) | ORAL | 2 refills | Status: DC

## 2019-08-26 MED ORDER — TEMAZEPAM 30 MG PO CAPS: 30.0000 mg | ORAL_CAPSULE | Freq: Every day | ORAL | 0 refills | Status: DC

## 2019-08-26 NOTE — Progress Notes (Signed)
Pt discharged to lobby. Pt was stable and appreciative at that time. All papers and prescriptions were given and valuables returned. Verbal understanding expressed. Denies SI/HI and A/VH. Pt given opportunity to express concerns and ask questions.  

## 2019-08-26 NOTE — BH Assessment (Addendum)
BHH Assessment Progress Note  Per Malvin Johns, MD, this voluntary pt requires psychiatric hospitalization at this time.  Pt has been pre-assigned to [facility] in anticipation of a discharged scheduled for later today.  Pt's nurse, Lanora Manis, has been notified.  Doylene Canning, [place] Behavioral Health Coordinator [phone]   Addendum:  After further review it has been determined that pt is safe for discharge from the [facility] Observation Unit.  Pt's discharge instructions advise her to continue treatment with [facility].  Lanora Manis has been notified, as has Administrator, Civil Service bed control.  Doylene Canning, [place] Behavioral Health Coordinator [phone]

## 2019-08-26 NOTE — Discharge Summary (Signed)
Physician Discharge Summary Note  Patient:  Savannah [name] is an 34 y.o., female MRN:  [id number] DOB:  Apr 22, 1986 Patient phone:  [phone] (home)  Patient address:   [address] [place] [address],  Total Time spent with patient: 45 minutes  Date of Admission:  08/25/2019 Date of Discharge: 08/26/2019  Reason for Admission:   Savannah [name] is an 34 y.o. female. Pt was brought in by [name]. Per [name] the Pt is having auditory hallucinations and does not have her medications for Schizophrenia. The Pt was irritated and did not want to participate in the assessment. The Pt would only inform this writer that she was "tired of this Schizophrenia stuff." Per [name] the Pt is seen at [facility] but the Pt cannot get an appointment for medication management. [name] states that the Pt is disorganized and manic. [name] states that the Pt is not suicidal or homicidal but she is destroying his home. The Pt was hospitalized in December 2020 at [facility]. Principal Problem: Noncompliance with meds and resultant exacerbation and disorder Discharge Diagnoses: Active Problems:   Schizophrenia [facility])    Past Psychiatric History: 2 recent evaluation/admissions  Past Medical History:  Past Medical History:  Diagnosis Date  . Depression   . Kidney infection    No past surgical history on file. Family History:  Family History  Problem Relation Age of Onset  . Diabetes Other   . Hypertension Other   . Stroke Other    Family Psychiatric  History: No new data Social History:  Social History   Substance and Sexual Activity  Alcohol Use Yes   Comment: Occasional Drinker     Social History   Substance and Sexual Activity  Drug Use Yes  . Types: Marijuana    Social History   Socioeconomic History  . Marital status: Single    Spouse name: Not on file  . Number of children: Not on file  . Years of education: Not on file  . Highest education level: Not on  file  Occupational History  . Not on file  Tobacco Use  . Smoking status: Current Every Day Smoker    Packs/day: 0.30    Types: Cigarettes  . Smokeless tobacco: Never Used  Substance and Sexual Activity  . Alcohol use: Yes    Comment: Occasional Drinker  . Drug use: Yes    Types: Marijuana  . Sexual activity: Yes    Birth control/protection: None  Other Topics Concern  . Not on file  Social History Narrative  . Not on file   Social Determinants of Health   Financial Resource Strain:   . Difficulty of Paying Living Expenses: Not on file  Food Insecurity:   . Worried About Programme researcher, broadcasting/film/video in the Last Year: Not on file  . Ran Out of Food in the Last Year: Not on file  Transportation Needs:   . Lack of Transportation (Medical): Not on file  . Lack of Transportation (Non-Medical): Not on file  Physical Activity:   . Days of Exercise per Week: Not on file  . Minutes of Exercise per Session: Not on file  Stress:   . Feeling of Stress : Not on file  Social Connections:   . Frequency of Communication with Friends and Family: Not on file  . Frequency of Social Gatherings with Friends and Family: Not on file  . Attends Religious Services: Not on file  . Active Member of Clubs  or Organizations: Not on file  . Attends Banker Meetings: Not on file  . Marital Status: Not on file    Hospital Course:    Savannah [name] was admitted in the observation unit and monitored overnight restarted on her medication apparently she had not taken any after her samples ran out as she missed her appointment at [facility] and did not fill her previously printed prescriptions.  By the morning of the 18th she wanted to go home she denied hallucinations stated she had slept and denied any positive symptoms on eval.  She was coherent and thought goal-directed denied again any positive symptoms and was requesting to go.  I felt she should probably come in the hospital because she been off of her  medications however her mother phone stating she wanted her home and her fianc was contacted as well.  When I spoke with her fianc he elaborated that she was "just needing her medication" wanted to work from home and felt that she could be safe at home and since she was not suicidal, and her symptoms had already been diminished, we will going to go ahead and discharge her with the prescriptions.  Again she was quite ill prior to presentation but simply getting back on her meds and getting a night of sleep seem to recalibrate her to the point where there were no further or acute positive symptoms at this point in time so I am okay with her going home as long as she is compliant with meds and they know to bring her back if she has any level of dangerousness  However because she believes that the Risperdal was too expensive, even generic it was over $500 according to her report, we will prescribe Prolixin  Musculoskeletal: Strength & Muscle Tone: within normal limits Gait & Station: normal Patient leans: N/A  Psychiatric Specialty Exam: Physical Exam  Review of Systems  Blood pressure 140/81, pulse 100, temperature (!) 97.3 F (36.3 C), temperature source Oral, resp. rate 18, SpO2 100 %.There is no height or weight on file to calculate BMI.  General Appearance: Casual  Eye Contact:  Good  Speech:  Clear and Coherent  Volume:  Normal  Mood:  Euthymic  Affect:  Congruent  Thought Process:  Linear  Orientation:  Full (Time, Place, and Person)  Thought Content:  Denies current positive symptoms  Suicidal Thoughts:  No  Homicidal Thoughts:  No  Memory:  Immediate;   Good Recent;   Good Remote;   Good  Judgement:  Good  Insight:  Good  Psychomotor Activity:  Normal  Concentration:  Concentration: Fair and Attention Span: Fair  Recall:  [employer] of Knowledge:  Fair  Language:  Fair  Akathisia:  Negative  Handed:  Right  AIMS (if indicated):     Assets:  Communication Skills Desire  for Improvement  ADL's:  Intact  Cognition:  WNL  Sleep:           Has this patient used any form of tobacco in the last 30 days? (Cigarettes, Smokeless Tobacco, Cigars, and/or Pipes) Yes, No  Blood Alcohol level:  Lab Results  Component Value Date   [facility] <10 07/30/2019   ETH <11 09/27/2013    Metabolic Disorder Labs:  Lab Results  Component Value Date   HGBA1C 5.3 08/01/2019   MPG 105.41 08/01/2019   Lab Results  Component Value Date   PROLACTIN 34.9 (H) 08/01/2019   Lab Results  Component Value Date   CHOL  118 08/01/2019   TRIG 52 08/01/2019   HDL 33 (L) 08/01/2019   CHOLHDL 3.6 08/01/2019   VLDL 10 08/01/2019   LDLCALC 75 08/01/2019    See Psychiatric Specialty Exam and Suicide Risk Assessment completed by Attending Physician prior to discharge.  Discharge destination:  Home  Is patient on multiple antipsychotic therapies at discharge:  No   Has Patient had three or more failed trials of antipsychotic monotherapy by history:  No  Recommended Plan for Multiple Antipsychotic Therapies: NA   Allergies as of 08/26/2019      Reactions   Sudafed [pseudoephedrine] Swelling   Swelling      Medication List    STOP taking these medications   risperiDONE 3 MG tablet Commonly known as: RISPERDAL     TAKE these medications     Indication  albuterol 108 (90 Base) MCG/ACT inhaler Commonly known as: VENTOLIN HFA Inhale 1-2 puffs into the lungs every 6 (six) hours as needed for wheezing or shortness of breath.  Indication: Asthma   benzonatate 200 MG capsule Commonly known as: TESSALON Take 1 capsule (200 mg total) by mouth 2 (two) times daily as needed for cough.  Indication: Cough   benztropine 1 MG tablet Commonly known as: COGENTIN Take 1 tablet (1 mg total) by mouth 2 (two) times daily.  Indication: Extrapyramidal Reaction caused by Medications   fluPHENAZine 10 MG tablet Commonly known as: PROLIXIN Take 1 tablet (10 mg total) by mouth 2 (two)  times daily.  Indication: Schizophrenia   temazepam 30 MG capsule Commonly known as: RESTORIL Take 1 capsule (30 mg total) by mouth at bedtime.  Indication: Trouble Sleeping       SignedJohnn Hai, MD 08/26/2019, 11:17 AM

## 2019-08-26 NOTE — Discharge Instructions (Signed)
For your behavioral health needs, you are advised to continue treatment with [facility]: ° °     [facility] °     [address] °     [place], [place] [address] °     [phone] °     Crisis number: [phone] ° °

## 2019-08-26 NOTE — BHH Suicide Risk Assessment (Signed)
Savannah Savannah [name]   Principal Problem: Noncompliance with meds and resultant psychosis Discharge Diagnoses: Active Problems:   Schizophrenia (HCC)   Total Time spent with patient: 45 minutes Musculoskeletal: Strength & Muscle Tone: within normal limits Gait & Station: normal Patient leans: N/A  Psychiatric Specialty Exam: Physical Exam  Review of Systems  Blood pressure 140/81, pulse 100, temperature (!) 97.3 F (36.3 C), temperature source Oral, resp. rate 18, SpO2 100 %.There is no height or weight on file to calculate BMI.  General Appearance: Casual  Eye Contact:  Good  Speech:  Clear and Coherent  Volume:  Normal  Mood:  Euthymic  Affect:  Congruent  Thought Process:  Linear  Orientation:  Full (Time, Place, and Person)  Thought Content:  Denies current positive symptoms  Suicidal Thoughts:  No  Homicidal Thoughts:  No  Memory:  Immediate;   Good Recent;   Good Remote;   Good  Judgement:  Good  Insight:  Good  Psychomotor Activity:  Normal  Concentration:  Concentration: Fair and Attention Span: Fair  Recall:  [employer] of Knowledge:  Fair  Language:  Fair  Akathisia:  Negative  Handed:  Right  AIMS (if indicated):     Assets:  Communication Skills Desire for Improvement  ADL's:  Intact  Cognition:  WNL  Sleep:        Mental Status Per Nursing Savannah [name]::   On Admission:  NA  Demographic Factors:  NA  Loss Factors: NA  Historical Factors: NA  Risk Reduction Factors:   Sense of responsibility to family, Religious beliefs about death, Employed, Living with another person, especially a relative, Positive social support, Positive therapeutic relationship and Positive coping skills or problem solving skills  Continued Clinical Symptoms:  Previous Psychiatric Diagnoses and Treatments  Cognitive Features That Contribute To Risk:  None    Suicide Risk:  Minimal: No identifiable suicidal ideation.  Patients presenting with  no risk factors but with morbid ruminations; may be classified as minimal risk based on the severity of the depressive symptoms    Plan Of Care/Follow-up recommendations:  Activity:  full  Savannah Orozco, MD 08/26/2019, 11:21 AM

## 2019-09-30 ENCOUNTER — Other Ambulatory Visit: Payer: Self-pay

## 2019-09-30 ENCOUNTER — Inpatient Hospital Stay (HOSPITAL_COMMUNITY)
Admission: AD | Admit: 2019-09-30 | Discharge: 2019-10-02 | DRG: 885 | Disposition: A | Discharge status: Home / Self Care | Payer: Federal, State, Local not specified - Other | Attending: Psychiatry | Admitting: Psychiatry

## 2019-09-30 DIAGNOSIS — F940 Selective mutism: Secondary | ICD-10-CM | POA: Diagnosis present

## 2019-09-30 DIAGNOSIS — Z9119 Patient's noncompliance with other medical treatment and regimen: Secondary | ICD-10-CM

## 2019-09-30 DIAGNOSIS — G4700 Insomnia, unspecified: Secondary | ICD-10-CM | POA: Diagnosis present

## 2019-09-30 DIAGNOSIS — F17210 Nicotine dependence, cigarettes, uncomplicated: Secondary | ICD-10-CM | POA: Diagnosis present

## 2019-09-30 DIAGNOSIS — J45909 Unspecified asthma, uncomplicated: Secondary | ICD-10-CM | POA: Diagnosis present

## 2019-09-30 DIAGNOSIS — R4182 Altered mental status, unspecified: Secondary | ICD-10-CM | POA: Diagnosis present

## 2019-09-30 DIAGNOSIS — R451 Restlessness and agitation: Secondary | ICD-10-CM | POA: Diagnosis present

## 2019-09-30 DIAGNOSIS — F419 Anxiety disorder, unspecified: Secondary | ICD-10-CM | POA: Diagnosis present

## 2019-09-30 DIAGNOSIS — Z20822 Contact with and (suspected) exposure to covid-19: Secondary | ICD-10-CM | POA: Diagnosis present

## 2019-09-30 DIAGNOSIS — F29 Unspecified psychosis not due to a substance or known physiological condition: Secondary | ICD-10-CM | POA: Diagnosis present

## 2019-09-30 DIAGNOSIS — F200 Paranoid schizophrenia: Secondary | ICD-10-CM | POA: Diagnosis not present

## 2019-09-30 DIAGNOSIS — F209 Schizophrenia, unspecified: Secondary | ICD-10-CM | POA: Diagnosis present

## 2019-09-30 DIAGNOSIS — F201 Disorganized schizophrenia: Secondary | ICD-10-CM

## 2019-09-30 LAB — RESPIRATORY PANEL BY RT PCR (FLU A&B, COVID)
Influenza A by PCR: NEGATIVE
Influenza B by PCR: NEGATIVE
SARS Coronavirus 2 by RT PCR: NEGATIVE

## 2019-09-30 MED ORDER — LORAZEPAM 1 MG PO TABS
1.0000 mg | ORAL_TABLET | ORAL | Status: AC | PRN
  Administered 2019-09-30: 1 mg via ORAL
  Filled 2019-09-30 (×2): qty 1

## 2019-09-30 MED ORDER — ACETAMINOPHEN 325 MG PO TABS
650.0000 mg | ORAL_TABLET | Freq: Four times a day (QID) | ORAL | Status: DC | PRN
  Filled 2019-09-30: qty 2

## 2019-09-30 MED ORDER — BENZTROPINE MESYLATE 1 MG PO TABS
1.0000 mg | ORAL_TABLET | Freq: Two times a day (BID) | ORAL | Status: DC
  Administered 2019-10-01: 20:00:00 1 mg via ORAL
  Filled 2019-09-30 (×8): qty 1

## 2019-09-30 MED ORDER — OLANZAPINE 10 MG PO TBDP
10.0000 mg | ORAL_TABLET | Freq: Three times a day (TID) | ORAL | Status: DC | PRN
  Administered 2019-09-30 – 2019-10-01 (×3): 10 mg via ORAL
  Filled 2019-09-30 (×5): qty 1

## 2019-09-30 MED ORDER — MAGNESIUM HYDROXIDE 400 MG/5ML PO SUSP
30.0000 mL | Freq: Every day | ORAL | Status: DC | PRN
  Filled 2019-09-30: qty 30

## 2019-09-30 MED ORDER — TRAZODONE HCL 50 MG PO TABS
50.0000 mg | ORAL_TABLET | Freq: Every evening | ORAL | Status: DC | PRN
  Filled 2019-09-30 (×2): qty 1

## 2019-09-30 MED ORDER — ZIPRASIDONE MESYLATE 20 MG IM SOLR
20.0000 mg | INTRAMUSCULAR | Status: AC | PRN
  Administered 2019-09-30: 15:00:00 20 mg via INTRAMUSCULAR
  Filled 2019-09-30 (×2): qty 20

## 2019-09-30 MED ORDER — ALUM & MAG HYDROXIDE-SIMETH 200-200-20 MG/5ML PO SUSP
30.0000 mL | ORAL | Status: DC | PRN
  Filled 2019-09-30: qty 30

## 2019-09-30 MED ORDER — FLUPHENAZINE HCL 10 MG PO TABS
10.0000 mg | ORAL_TABLET | Freq: Two times a day (BID) | ORAL | Status: DC
  Administered 2019-10-01: 20:00:00 10 mg via ORAL
  Filled 2019-09-30: qty 2
  Filled 2019-09-30 (×6): qty 1
  Filled 2019-09-30: qty 2
  Filled 2019-09-30: qty 1

## 2019-09-30 MED ORDER — ALBUTEROL SULFATE HFA 108 (90 BASE) MCG/ACT IN AERS
1.0000 | INHALATION_SPRAY | Freq: Four times a day (QID) | RESPIRATORY_TRACT | Status: DC | PRN
  Filled 2019-09-30: qty 6.7

## 2019-09-30 MED ORDER — HYDROXYZINE HCL 25 MG PO TABS
25.0000 mg | ORAL_TABLET | Freq: Four times a day (QID) | ORAL | Status: DC | PRN
  Filled 2019-09-30: qty 1

## 2019-09-30 MED ORDER — TEMAZEPAM 30 MG PO CAPS
30.0000 mg | ORAL_CAPSULE | Freq: Every day | ORAL | Status: DC
  Administered 2019-10-01: 20:00:00 30 mg via ORAL
  Filled 2019-09-30 (×3): qty 1

## 2019-09-30 NOTE — Progress Notes (Signed)
Pt verbally aggressive. Several attempts to calm patient provided. Provided water, offered food, offered talking with RN, given time alone in room. Pt refusing to stay in room. Pt increasingly verbally aggressive. Pt cooperative with prn medications. See MAR.

## 2019-09-30 NOTE — H&P (Signed)
Behavioral Health Medical Screening Exam  Savannah [name] is an 34 y.o. female with history of schizophrenia, brought to [facility] by her fiance who reported to intake staff that patient has been off her medications and destroyed property at their apartment. Her fiance dropped her off at the hospital, and patient will not provide consent to speak with him. Patient is selectively mute on assessment and will not answer most questions, other than speaking incoherently at times. She appears to be responding to internal stimuli. She is labile, laughing at times inappropriately and then yelling. She refuses to enter treatment voluntarily.   Total Time spent with patient: 15 minutes  Psychiatric Specialty Exam: Physical Exam  Vitals reviewed. Constitutional: She appears well-developed and well-nourished.  Cardiovascular: Normal rate.  Respiratory: Effort normal.  Neurological: She is alert.    Review of Systems  Constitutional: Negative.   Psychiatric/Behavioral: Positive for agitation, behavioral problems and hallucinations.    Blood pressure 124/75, pulse 95, temperature 98 F (36.7 C), temperature source Oral, resp. rate 18, SpO2 99 %.There is no height or weight on file to calculate BMI.  General Appearance: Casual  Eye Contact:  Minimal  Speech:  incoherent  Volume:  decreased at times, increased at times  Mood:  refuses to answer; appears irritable and labile  Affect:  Inappropriate and Labile  Thought Process:  Disorganized  Orientation:  Other:  UTA- patient will not answer  Thought Content:  Illogical  Suicidal Thoughts:  UTA- patient will not answer  Homicidal Thoughts:  UTA-patient will not answer  Memory:  UTA- patient will not answer  Judgement:  Impaired  Insight:  Lacking  Psychomotor Activity:  Increased  Concentration: Concentration: Poor and Attention Span: Poor  Recall:  Poor  Fund of Knowledge:Fair  Language: Fair  Akathisia:  No  Handed:  Right  AIMS (if indicated):      Assets:  Housing Physical Health Social Support  Sleep:       Musculoskeletal: Strength & Muscle Tone: within normal limits Gait & Station: normal Patient leans: N/A  Blood pressure 124/75, pulse 95, temperature 98 F (36.7 C), temperature source Oral, resp. rate 18, SpO2 99 %.  Recommendations:  Based on my evaluation the patient does not appear to have an emergency medical condition.  Overnight observation to monitor and restart medications. IVC paperwork faxed to magistrate.  Aldean Baker, NP 09/30/2019, 2:10 PM

## 2019-09-30 NOTE — BH Assessment (Signed)
Assessment Note  Savannah [name] is an 34 y.o. female that presents this date at [facility] with altered mental state. Patient is transported by her long term partner Don Broach [phone] who is not present at the time the patient was seen by this Clinical research associate. This writer attempted to contact that individual  to gather collateral information although he was not available at the number provided. Patient is observed to be very guarded and renders limited history. Patient is making random incoherent statements as this Clinical research associate attempts to gather history. Patient will not respond to this writer's questions and is observed to sit and stare at this writer. Patient will also get up during the interview and attempt to leave her triage room and facility. Patient does not seem to be processing the content of this writer's questions although was able to redirect back to her room. This writer attempts to repeat and re-frame questions unsuccessfully. Patient is observed to be very agitated and angry making various negative comments directed towards this Clinical research associate using profanity at times. Information to complete assessment was obtained from admission notes and prior history. Per chart review patient was last seen on 08/25/19 when she presented with similar symptoms. Per that event patient was noted to have a history of Schizophrenia and was at that time, receiving services from [place] who assisted with medication management. It is unclear this date if patient continues to receive services from that provider or is complaint with any medication regimen. Patient has a history of AVH although does not seem to be responding to internal stimuli this date. Patient also has a history of Cannabis use although it is unclear if patient is currently using that substance this date with UDS pending. Patient this date presents very disorganized and is demanding to leave. Due to current altered mental state IVC is currently being initiated. Patient per  notes does not have a history of self harm or intent to harm others. Patient will not respond to orientation questions. Patient is observed to actively psychotic with speech that is pressured and threatening at times. Patient is observed to be pacing around the room and has attempted several times to exit the facility. Patient is demanding to leave and makes threatening gestures and comments towards this Clinical research associate. She appears paranoid, guarded and delusional. Patient does not appear to be responding to internal stimuli although it is unclear based on presentation.       Diagnosis: F20.0 Paranoid Schizophrenia (per history)   Past Medical History:  Past Medical History:  Diagnosis Date  . Depression   . Kidney infection     No past surgical history on file.  Family History:  Family History  Problem Relation Age of Onset  . Diabetes Other   . Hypertension Other   . Stroke Other     Social History:  reports that she has been smoking cigarettes. She has been smoking about 0.30 packs per day. She has never used smokeless tobacco. She reports current alcohol use. She reports current drug use. Drug: Marijuana.  Additional Social History:  Alcohol / Drug Use Pain Medications: See MAR Prescriptions: See MAR Over the Counter: See MAR History of alcohol / drug use?: Yes Longest period of sobriety (when/how long): Unknown Negative Consequences of Use: (Denies) Withdrawal Symptoms: (Denies) Substance #1 Name of Substance 1: Cannibis per hx 1 - Age of First Use: UTA 1 - Amount (size/oz): UTA 1 - Frequency: UTA 1 - Duration: UTA 1 - Last Use / Amount: UTA  CIWA:  COWS:    Allergies:  Allergies  Allergen Reactions  . Sudafed [Pseudoephedrine] Swelling    Swelling     Home Medications: (Not in a hospital admission)   OB/GYN Status:  No LMP recorded. (Menstrual status: Irregular Periods).  General Assessment Data Location of Assessment: [facility] Assessment Services TTS Assessment:  In system Is this a Tele or Face-to-Face Assessment?: Face-to-Face Is this an Initial Assessment or a Re-assessment for this encounter?: Initial Assessment Patient Accompanied by:: N/A Language Other than English: No Living Arrangements: Other (Comment) What gender do you identify as?: Female Marital status: Long term relationship Maiden name: (NA) Pregnancy Status: No Living Arrangements: Spouse/significant other Can pt return to current living arrangement?: Yes Admission Status: Involuntary Petitioner: ED Attending Is patient capable of signing voluntary admission?: Yes Referral Source: Self/Family/Friend Insurance type: SP  Medical Screening Exam [facility] Walk-in ONLY) Medical Exam completed: Yes  Crisis Care Plan Living Arrangements: Spouse/significant other Legal Guardian: (Self) Name of Psychiatrist: None Name of Therapist: None  Education Status Is patient currently in school?: No Highest grade of school patient has completed: GED Is the patient employed, unemployed or receiving disability?: Unemployed  Risk to self with the past 6 months Suicidal Ideation: No Has patient been a risk to self within the past 6 months prior to admission? : No Suicidal Intent: No Has patient had any suicidal intent within the past 6 months prior to admission? : No Is patient at risk for suicide?: No, but patient needs Medical Clearance Suicidal Plan?: No Has patient had any suicidal plan within the past 6 months prior to admission? : No Access to Means: No What has been your use of drugs/alcohol within the last 12 months?: NA Previous Attempts/Gestures: No How many times?: 0 Other Self Harm Risks: (NA) Triggers for Past Attempts: (NA) Intentional Self Injurious Behavior: None Family Suicide History: No Recent stressful life event(s): Other (Comment)(Off medications) Persecutory voices/beliefs?: No Depression: No Depression Symptoms: (UTA) Substance abuse history and/or treatment  for substance abuse?: No Suicide prevention information given to non-admitted patients: Not applicable  Risk to Others within the past 6 months Homicidal Ideation: No Does patient have any lifetime risk of violence toward others beyond the six months prior to admission? : No Thoughts of Harm to Others: No Current Homicidal Intent: No Current Homicidal Plan: No Access to Homicidal Means: No Identified Victim: NA History of harm to others?: No Assessment of Violence: None Noted Violent Behavior Description: NA Does patient have access to weapons?: No Criminal Charges Pending?: No Does patient have a court date: No Is patient on probation?: No  Psychosis Hallucinations: Auditory Delusions: None noted  Mental Status Report Appearance/Hygiene: Bizarre Eye Contact: Poor Motor Activity: Agitation Speech: Argumentative Level of Consciousness: Restless Mood: Anxious, Angry Affect: Threatening Anxiety Level: Moderate Thought Processes: Unable to Assess Judgement: Unable to Assess Orientation: Unable to assess Obsessive Compulsive Thoughts/Behaviors: Unable to Assess  Cognitive Functioning Concentration: Unable to Assess Memory: Unable to Assess Is patient IDD: No Insight: Unable to Assess Impulse Control: Unable to Assess Appetite: (UTA) Have you had any weight changes? : (UTA) Sleep: (UTA) Total Hours of Sleep: (UTA) Vegetative Symptoms: (UTA)  ADLScreening [facility] Assessment Services) Patient's cognitive ability adequate to safely complete daily activities?: Yes Patient able to express need for assistance with ADLs?: Yes Independently performs ADLs?: Yes (appropriate for developmental age)  Prior Inpatient Therapy Prior Inpatient Therapy: Yes Prior Therapy Dates: 2020 Prior Therapy Facilty/Provider(s): [facility] Reason for Treatment: MH issues  Prior Outpatient Therapy Prior Outpatient  Therapy: Yes Prior Therapy Dates: Past per notes Prior Therapy Facilty/Provider(s):  [facility] per hx Reason for Treatment: Med mang Does patient have an ACCT team?: No Does patient have Intensive In-House Services?  : No Does patient have [facility] services? : No Does patient have P4CC services?: No  ADL Screening (condition at time of admission) Patient's cognitive ability adequate to safely complete daily activities?: Yes Is the patient deaf or have difficulty hearing?: No Does the patient have difficulty seeing, even when wearing glasses/contacts?: No Does the patient have difficulty concentrating, remembering, or making decisions?: No Patient able to express need for assistance with ADLs?: Yes Does the patient have difficulty dressing or bathing?: No Independently performs ADLs?: Yes (appropriate for developmental age) Does the patient have difficulty walking or climbing stairs?: No Weakness of Legs: None Weakness of Arms/Hands: None  Home Assistive Devices/Equipment Home Assistive Devices/Equipment: None  Therapy Consults (therapy consults require a physician order) PT Evaluation Needed: No OT Evalulation Needed: No SLP Evaluation Needed: No Abuse/Neglect Assessment (Assessment to be complete while patient is alone) Physical Abuse: Denies Verbal Abuse: Denies Sexual Abuse: Denies Exploitation of patient/patient's resources: Denies Values / Beliefs Cultural Requests During Hospitalization: None Spiritual Requests During Hospitalization: None Consults Spiritual Care Consult Needed: No Transition of Care Team Consult Needed: No Advance Directives (For Healthcare) Does Patient Have a Medical Advance Directive?: No Would patient like information on creating a medical advance directive?: No - Patient declined          Disposition:  Disposition Initial Assessment Completed for this Encounter: Yes Disposition of Patient: Admit Type of inpatient treatment program: Adult  On Site Evaluation by:   Reviewed with Physician:    Mamie Nick 09/30/2019  1:34 PM

## 2019-09-30 NOTE — Progress Notes (Signed)
Patient ID: Savannah [name], female   DOB: [birth year], 34 y.o.   MRN: [id number] Pt sedated at present, Given Geodon, Ativan and Zyprexa on dayshift.  Resp even and unlabored, no distress noted, calm & cooperative at present.  Q 15 min rounding, monitoring for safety.
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
## 2019-10-01 ENCOUNTER — Encounter (HOSPITAL_COMMUNITY): Payer: Self-pay | Admitting: Psychiatry

## 2019-10-01 DIAGNOSIS — F200 Paranoid schizophrenia: Secondary | ICD-10-CM

## 2019-10-01 LAB — COMPREHENSIVE METABOLIC PANEL
ALT: 13 U/L (ref 0–44)
AST: 16 U/L (ref 15–41)
Albumin: 3.9 g/dL (ref 3.5–5.0)
Alkaline Phosphatase: 61 U/L (ref 38–126)
Anion gap: 8 (ref 5–15)
BUN: 11 mg/dL (ref 6–20)
CO2: 26 mmol/L (ref 22–32)
Calcium: 9.2 mg/dL (ref 8.9–10.3)
Chloride: 107 mmol/L (ref 98–111)
Creatinine, Ser: 0.72 mg/dL (ref 0.44–1.00)
GFR calc Af Amer: >60 mL/min (ref >60)
GFR calc non Af Amer: >60 mL/min (ref >60)
Glucose, Bld: 93 mg/dL (ref 70–99)
Potassium: 3.6 mmol/L (ref 3.5–5.1)
Sodium: 141 mmol/L (ref 135–145)
Total Bilirubin: 0.7 mg/dL (ref 0.3–1.2)
Total Protein: 7.1 g/dL (ref 6.5–8.1)

## 2019-10-01 LAB — CBC
HCT: 42.7 % (ref 36.0–46.0)
Hemoglobin: 13.2 g/dL (ref 12.0–15.0)
MCH: 26.1 pg (ref 26.0–34.0)
MCHC: 30.9 g/dL (ref 30.0–36.0)
MCV: 84.4 fL (ref 80.0–100.0)
Platelets: 240 10*3/uL (ref 150–400)
RBC: 5.06 MIL/uL (ref 3.87–5.11)
RDW: 17.5 % — ABNORMAL HIGH (ref 11.5–15.5)
WBC: 4.8 10*3/uL (ref 4.0–10.5)
nRBC: 0 % (ref 0.0–0.2)

## 2019-10-01 LAB — HEMOGLOBIN A1C
Hgb A1c MFr Bld: 5.5 % (ref 4.8–5.6)
Mean Plasma Glucose: 111.15 mg/dL

## 2019-10-01 LAB — LIPID PANEL
Cholesterol: 132 mg/dL (ref 0–200)
HDL: 30 mg/dL — ABNORMAL LOW (ref >40)
LDL Cholesterol: 74 mg/dL (ref 0–99)
Total CHOL/HDL Ratio: 4.4 RATIO
Triglycerides: 140 mg/dL (ref <150)
VLDL: 28 mg/dL (ref 0–40)

## 2019-10-01 LAB — ETHANOL: Alcohol, Ethyl (B): <10 mg/dL (ref <10)

## 2019-10-01 LAB — TSH: TSH: 0.901 u[IU]/mL (ref 0.350–4.500)

## 2019-10-01 MED ORDER — LORAZEPAM 1 MG PO TABS: 1.0000 mg | ORAL_TABLET | ORAL | Status: DC | PRN

## 2019-10-01 MED ORDER — FLUPHENAZINE HCL 5 MG PO TABS
5.0000 mg | ORAL_TABLET | ORAL | Status: AC
  Administered 2019-10-01: 5 mg via ORAL

## 2019-10-01 MED ORDER — OLANZAPINE 10 MG PO TBDP: 10.0000 mg | ORAL_TABLET | Freq: Three times a day (TID) | ORAL | Status: DC | PRN

## 2019-10-01 MED ORDER — ZIPRASIDONE MESYLATE 20 MG IM SOLR: 20.0000 mg | INTRAMUSCULAR | Status: DC | PRN

## 2019-10-01 NOTE — Plan of Care (Signed)
Progress note  D: pt found in the dayroom yelling at other patients. Pt stated they needed something for anxiety. Pt declined their other medications stating they don't need them. Pt has since been calm but other patients have become agitated with their behavior. Pt denies si/hi/ah/vh and verbally agrees to approach staff if these become apparent or before harming themself/others while at [facility].  A: Pt provided support and encouragement. Pt given medication per protocol and standing orders. Q45m safety checks implemented and continued.  R: Pt safe on the unit. Will continue to monitor.  Pt progressing in the following metrics  Problem: Education: Goal: Knowledge of [place] [facility] Education information/materials will improve Outcome: Progressing Goal: Emotional status will improve Outcome: Progressing Goal: Mental status will improve Outcome: Progressing

## 2019-10-01 NOTE — Progress Notes (Signed)
Recreation Therapy Notes  Date: 2.23.21 Time: 1000 Location: 500 Hall Dayroom  Group Topic: Communication, Team Building, Problem Solving  Goal Area(s) Addresses:  Patient will effectively work with peer towards shared goal.  Patient will identify skill used to make activity successful.  Patient will identify how skills used during activity can be used to reach post d/c goals.   Behavioral Response: Engaged  Intervention: STEM Activity   Activity: In team's, using 20 small plastic cups, patients were asked to build the tallest free standing tower possible.    Education: Pharmacist, community, Building control surveyor.   Education Outcome: Acknowledges education/In group clarification offered/Needs additional education.   Clinical Observations/Feedback: Pt worked alone on the activity.  Pt attempted to complete the project but was unable to complete it.  Pt was quiet and attentive to what the activity required.  Pt was focused and pleasant.     Caroll Rancher, LRT/CTRS    Caroll Rancher A 10/01/2019 10:37 AM
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
## 2019-10-01 NOTE — Progress Notes (Addendum)
   10/01/19 2151  Psych Admission Type (Psych Patients Only)  Admission Status Involuntary  Psychosocial Assessment  Patient Complaints Irritability;Anxiety  Eye Contact Fair  Facial Expression Anxious  Affect Anxious;Preoccupied  Speech Aggressive;Logical/coherent  Interaction Assertive  Motor Activity Other (Comment) (WNL)  Appearance/Hygiene Unremarkable  Behavior Characteristics Cooperative;Anxious;Agitated  Mood Anxious;Labile  Aggressive Behavior  Effect No apparent injury  Thought Process  Coherency WDL  Content Ambivalence;Blaming others  Delusions None reported or observed  Perception WDL  Hallucination None reported or observed  Judgment Poor  Confusion None  Danger to Self  Current suicidal ideation? Denies  Danger to Others  Danger to Others None reported or observed   Pt takes meds without incident. Preoccupied with being discharged. Pt commended for her insight into her anger issues. Pt stated that she realized a situation had the potential to make her angry and she walked away from it.

## 2019-10-02 MED ORDER — BENZTROPINE MESYLATE 1 MG PO TABS: 1.0000 mg | ORAL_TABLET | Freq: Two times a day (BID) | ORAL | 2 refills | Status: DC

## 2019-10-02 MED ORDER — HALOPERIDOL 20 MG PO TABS: 20.0000 mg | ORAL_TABLET | Freq: Every day | ORAL | 5 refills | Status: DC

## 2019-10-02 MED ORDER — ALBUTEROL SULFATE HFA 108 (90 BASE) MCG/ACT IN AERS: 1.0000 | INHALATION_SPRAY | Freq: Four times a day (QID) | RESPIRATORY_TRACT | 0 refills | Status: DC | PRN

## 2019-10-02 MED ORDER — TEMAZEPAM 30 MG PO CAPS: 30.0000 mg | ORAL_CAPSULE | Freq: Every day | ORAL | 0 refills | Status: DC

## 2019-10-02 NOTE — Tx Team (Signed)
Interdisciplinary Treatment and Diagnostic Plan Update  10/02/2019 Time of Session: 9:00am Savannah [name] MRN: [id number]  Principal Diagnosis: <principal problem not specified>  Secondary Diagnoses: Active Problems:   Schizophrenia (HCC)   Current Medications:  Current Facility-Administered Medications  Medication Dose Route Frequency Provider Last Rate Last Admin  . acetaminophen (TYLENOL) tablet 650 mg  650 mg Oral Q6H PRN Aldean Baker, NP      . albuterol (VENTOLIN HFA) 108 (90 Base) MCG/ACT inhaler 1-2 puff  1-2 puff Inhalation Q6H PRN Aldean Baker, NP      . alum & mag hydroxide-simeth (MAALOX/MYLANTA) 200-200-20 MG/5ML suspension 30 mL  30 mL Oral Q4H PRN Aldean Baker, NP      . benztropine (COGENTIN) tablet 1 mg  1 mg Oral BID Aldean Baker, NP   1 mg at 10/01/19 2021  . fluPHENAZine (PROLIXIN) tablet 10 mg  10 mg Oral BID Aldean Baker, NP   10 mg at 10/01/19 2021  . hydrOXYzine (ATARAX/VISTARIL) tablet 25 mg  25 mg Oral Q6H PRN Aldean Baker, NP      . OLANZapine zydis (ZYPREXA) disintegrating tablet 10 mg  10 mg Oral Q8H PRN Antonieta Pert, MD       And  . LORazepam (ATIVAN) tablet 1 mg  1 mg Oral PRN Antonieta Pert, MD       And  . ziprasidone (GEODON) injection 20 mg  20 mg Intramuscular PRN Antonieta Pert, MD      . magnesium hydroxide (MILK OF MAGNESIA) suspension 30 mL  30 mL Oral Daily PRN Aldean Baker, NP      . temazepam (RESTORIL) capsule 30 mg  30 mg Oral QHS Aldean Baker, NP   30 mg at 10/01/19 2021  . traZODone (DESYREL) tablet 50 mg  50 mg Oral QHS PRN,MR X 1 Aldean Baker, NP       PTA Medications: Medications Prior to Admission  Medication Sig Dispense Refill Last Dose  . benzonatate (TESSALON) 200 MG capsule Take 1 capsule (200 mg total) by mouth 2 (two) times daily as needed for cough. (Patient not taking: Reported on 07/30/2019) 20 capsule 0 Completed Course at Unknown time  . fluPHENAZine (PROLIXIN) 10 MG tablet Take 1 tablet  (10 mg total) by mouth 2 (two) times daily. (Patient not taking: Reported on 10/01/2019) 90 tablet 1 Not Taking at Unknown time  . [DISCONTINUED] albuterol (VENTOLIN HFA) 108 (90 Base) MCG/ACT inhaler Inhale 1-2 puffs into the lungs every 6 (six) hours as needed for wheezing or shortness of breath. (Patient not taking: Reported on 10/01/2019) 18 g 0 Not Taking at Unknown time  . [DISCONTINUED] benztropine (COGENTIN) 1 MG tablet Take 1 tablet (1 mg total) by mouth 2 (two) times daily. (Patient not taking: Reported on 10/01/2019) 60 tablet 2 Not Taking at Unknown time  . [DISCONTINUED] temazepam (RESTORIL) 30 MG capsule Take 1 capsule (30 mg total) by mouth at bedtime. (Patient not taking: Reported on 10/01/2019) 30 capsule 0 Not Taking at Unknown time    Patient Stressors: Medication change or noncompliance Other: Being here  Patient Strengths: Average or above average intelligence Capable of independent living  Treatment Modalities: Medication Management, Group therapy, Case management,  1 to 1 session with clinician, Psychoeducation, Recreational therapy.   Physician Treatment Plan for Primary Diagnosis: <principal problem not specified> Long Term Goal(s):     Short Term Goals:    Medication Management: Evaluate patient's response, side effects,  and tolerance of medication regimen.  Therapeutic Interventions: 1 to 1 sessions, Unit Group sessions and Medication administration.  Evaluation of Outcomes: Adequate for Discharge  Physician Treatment Plan for Secondary Diagnosis: Active Problems:   Schizophrenia ([place])  Long Term Goal(s):     Short Term Goals:       Medication Management: Evaluate patient's response, side effects, and tolerance of medication regimen.  Therapeutic Interventions: 1 to 1 sessions, Unit Group sessions and Medication administration.  Evaluation of Outcomes: Adequate for Discharge   RN Treatment Plan for Primary Diagnosis: <principal problem not  specified> Long Term Goal(s): Knowledge of disease and therapeutic regimen to maintain health will improve  Short Term Goals: Ability to participate in decision making will improve, Ability to verbalize feelings will improve, Ability to disclose and discuss suicidal ideas, Ability to identify and develop effective coping behaviors will improve and Compliance with prescribed medications will improve  Medication Management: RN will administer medications as ordered by provider, will assess and evaluate patient's response and provide education to patient for prescribed medication. RN will report any adverse and/or side effects to prescribing provider.  Therapeutic Interventions: 1 on 1 counseling sessions, Psychoeducation, Medication administration, Evaluate responses to treatment, Monitor vital signs and CBGs as ordered, Perform/monitor CIWA, COWS, AIMS and Fall Risk screenings as ordered, Perform wound care treatments as ordered.  Evaluation of Outcomes: Adequate for Discharge   LCSW Treatment Plan for Primary Diagnosis: <principal problem not specified> Long Term Goal(s): Safe transition to appropriate next level of care at discharge, Engage patient in therapeutic group addressing interpersonal concerns.  Short Term Goals: Engage patient in aftercare planning with referrals and resources and Increase skills for wellness and recovery  Therapeutic Interventions: Assess for all discharge needs, 1 to 1 time with Social worker, Explore available resources and support systems, Assess for adequacy in community support network, Educate family and significant other(s) on suicide prevention, Complete Psychosocial Assessment, Interpersonal group therapy.  Evaluation of Outcomes: Adequate for Discharge   Progress in Treatment: Attending groups: Yes. Participating in groups: Yes. Taking medication as prescribed: Yes. Toleration medication: Yes. Family/Significant other contact made: Yes, individual(s)  contacted:  pt's fiance Patient understands diagnosis: Yes. Discussing patient identified problems/goals with staff: Yes. Medical problems stabilized or resolved: Yes. Denies suicidal/homicidal ideation: Yes. Issues/concerns per patient self-inventory: No. Other:   New problem(s) identified: No, Describe:  none   New Short Term/Long Term Goal(s): Medication stabilization, elimination of SI thoughts, and development of a comprehensive mental wellness plan.   Patient Goals:  "Don't come back"  Discharge Plan or Barriers: pt is adequate for discharge   Reason for Continuation of Hospitalization: Aggression Delusions  Medication stabilization  Estimated Length of Stay: pt is adequate for discharge   Attendees: Patient: Mairely Foxworth 10/02/2019   Physician: Dr. Jake Samples, MD 10/02/2019   Nursing: Legrand Como, RN 10/02/2019   RN Care Manager: 10/02/2019   Social Worker: Ardelle Anton, [place] 10/02/2019   Recreational Therapist:  10/02/2019   Other: Ovidio Kin, MSW intern  10/02/2019   Other:  10/02/2019   Other: 10/02/2019     Scribe for Treatment Team: Billey Chang, Student-Social Work 10/02/2019 9:40 AM

## 2019-10-02 NOTE — Progress Notes (Signed)
RN met with pt and reviewed pt's discharge instructions.  Pt verbalized understanding of discharge instructions and pt did not have any questions. RN returned pt's belongings to pt. RN gave pt Prescriptions, transition record and suicide risk assessment paperwork.  Pt denies SI/HI/AVH and voiced no concerns.  Patient discharged to the lobby without incident.

## 2019-10-02 NOTE — [facility] Suicide Risk Assessment (Signed)
[facility] Discharge Suicide Risk Assessment   Principal Problem: Acute exacerbation of schizophrenic disorder Discharge Diagnoses: Active Problems:   Schizophrenia (HCC)   Total Time spent with patient: 45 minutes  Mu Musculoskeletal: Strength & Muscle Tone: within normal limits Gait & Station: normal Patient leans: N/A  Psychiatric Specialty Exam: Physical Exam  Review of Systems  Blood pressure (!) 150/97, pulse 80, temperature (!) 97.5 F (36.4 C), temperature source Oral, resp. rate 20, height 5\' 3"  (1.6 m), weight 109.8 kg, SpO2 99 %.Body mass index is 42.87 kg/m.  General Appearance: Casual  Eye Contact:  Good  Speech:  Clear and Coherent  Volume:  Normal  Mood:  Euthymic  Affect:  Appropriate and Congruent  Thought Process:  Coherent and Goal Directed  Orientation:  Full (Time, Place, and Person)  Thought Content:  Logical  Suicidal Thoughts:  No  Homicidal Thoughts:  No  Memory:  Immediate;   Fair Recent;   Fair Remote;   Fair  Judgement:  Fair  Insight:  Fair  Psychomotor Activity:  Normal  Concentration:  Concentration: Fair and Attention Span: Fair  Recall:  Poor recent events/volatility/psychosis  of Knowledge:  Fair  Language:  Fair  Akathisia:  Negative  Handed:  Right  AIMS (if indicated):     Assets:  Housing Intimacy Leisure Time Physical Health Resilience  ADL's:  Intact  Cognition:  WNL  Sleep:  Number of Hours: 6.5     Mental Status Per Nursing Assessment::   On Admission:  NA  Demographic Factors:  Unemployed  Loss Factors: Decrease in vocational status  Historical Factors: Impulsivity  Risk Reduction Factors:   Sense of responsibility to family and Religious beliefs about death  Continued Clinical Symptoms:  Schizophrenia:   Less than 44 years old Paranoid or undifferentiated type  Cognitive Features That Contribute To Risk:  Closed-mindedness and Loss of executive function    Suicide Risk:  Minimal: No identifiable  suicidal ideation.  Patients presenting with no risk factors but with morbid ruminations; may be classified as minimal risk based on the severity of the depressive symptoms  Follow-up Information    [facility] Follow up on 10/08/2019.   Why: You are scheduled for an appointment on 10/08/19 at 8:15 am. This will be a virtual tele-health appointment.  If you are not able to do a virtual appointment, please call the provider to do in person appointment.  Contact information: [address] [place] [phone]          [phone], MD 10/02/2019, 8:29 AM

## 2019-10-02 NOTE — Progress Notes (Signed)
  [facility] Adult Case Management Discharge Plan :  Will you be returning to the same living situation after discharge:  Yes,  pt's home with fiance At discharge, do you have transportation home?: No. ; Patient reports that she will be walking home because it is not far Do you have the ability to pay for your medications: Yes,  private insurance  Release of information consent forms completed and in the chart;  Patient's signature needed at discharge.  Patient to Follow up at: Follow-up Information    [facility] Follow up on 10/08/2019.   Why: You are scheduled for an appointment on 10/08/19 at 8:15 am. This will be a virtual tele-health appointment.  If you are not able to do a virtual appointment, please call the provider to do in person appointment.  Contact information: [address] [place] [address] [phone]           Next level of care provider has access to [facility] Link:no  Safety Planning and Suicide Prevention discussed: Yes,  pt's fiance  Have you used any form of tobacco in the last 30 days? (Cigarettes, Smokeless Tobacco, Cigars, and/or Pipes): Yes  Has patient been referred to the Quitline?: Patient refused referral  Patient has been referred for addiction treatment: Yes  Delphia Grates, LCSW 10/02/2019, 9:40 AM
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
## 2019-10-02 NOTE — [facility] Suicide Risk Assessment (Signed)
[facility] INPATIENT:  Family/Significant Other Suicide Prevention Education  Suicide Prevention Education:  Education Completed; Pt's fiance, Savannah [name], has been identified by the patient as the family member/significant other with whom the patient will be residing, and identified as the person(s) who will aid the patient in the event of a mental health crisis (suicidal ideations/suicide attempt).  With written consent from the patient, the family member/significant other has been provided the following suicide prevention education, prior to the and/or following the discharge of the patient.  The suicide prevention education provided includes the following:  Suicide risk factors  Suicide prevention and interventions  National Suicide Hotline telephone number  [facility] assessment telephone number  [facility] Emergency Assistance 911  [facility] and/or Residential Mobile Crisis Unit telephone number  Request made of family/significant other to:  Remove weapons (e.g., guns, rifles, knives), all items previously/currently identified as safety concern.    Remove drugs/medications (over-the-counter, prescriptions, illicit drugs), all items previously/currently identified as a safety concern.  The family member/significant other verbalizes understanding of the suicide prevention education information provided.  The family member/significant other agrees to remove the items of safety concern listed above.   CSW contacted pt's fiance, Savannah [name]. Pt's fiance stated that he wants to make sure that she has a Transport planner appointment. CSW gave him that information. Pt's fiance asked about her medication and if she will obtain it cheaper since that was a problem from her last hospitalization. Pt's fiance stated that he does not have any other concerns and was made aware that she wanted to walk home.   Savannah [name] 10/02/2019, 9:43 AM

## 2019-10-07 ENCOUNTER — Emergency Department (HOSPITAL_COMMUNITY)
Admission: EM | Admit: 2019-10-07 | Discharge: 2019-10-07 | Disposition: A | Discharge status: Home / Self Care | Payer: Self-pay | Attending: Emergency Medicine | Admitting: Emergency Medicine

## 2019-10-07 ENCOUNTER — Encounter (HOSPITAL_COMMUNITY): Payer: Self-pay

## 2019-10-07 ENCOUNTER — Other Ambulatory Visit: Payer: Self-pay

## 2019-10-07 DIAGNOSIS — F209 Schizophrenia, unspecified: Secondary | ICD-10-CM | POA: Insufficient documentation

## 2019-10-07 DIAGNOSIS — F32A Depression, unspecified: Secondary | ICD-10-CM

## 2019-10-07 DIAGNOSIS — F319 Bipolar disorder, unspecified: Secondary | ICD-10-CM | POA: Insufficient documentation

## 2019-10-07 DIAGNOSIS — F329 Major depressive disorder, single episode, unspecified: Secondary | ICD-10-CM

## 2019-10-07 DIAGNOSIS — F17210 Nicotine dependence, cigarettes, uncomplicated: Secondary | ICD-10-CM | POA: Insufficient documentation

## 2019-10-07 NOTE — ED Provider Notes (Signed)
[facility]-EMERGENCY DEPT Provider Note   CSN: [id number] Arrival date & time: 10/07/19  0701     History Chief Complaint  Patient presents with  . Psychiatric Evaluation    Savannah [name] is a 34 y.o. female.  HPI Patient presents to the emergency department requesting refills of the medications.  Patient states that she just got out of [facility] and was brought here by her fianc who states that she freaked out and threw the prescription on the window.  The patient states that she is unsure of what medication the prescription was for.  Patient states she has an appointment tomorrow with her psychiatrist.  She denies any suicidal homicidal ideations at this time.  Patient was just released from [facility] this morning.    Past Medical History:  Diagnosis Date  . Depression   . Kidney infection     Patient Active Problem List   Diagnosis Date Noted  . Bipolar I disorder, current or most recent episode manic, with psychotic features (HCC)   . Schizophrenia (HCC) 07/31/2019  . Psychosis (HCC) 09/28/2013  . 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 No pertinent surgical history.   OB History   No obstetric history on file.     Family History  Problem Relation Age of Onset  . Diabetes Other   . Hypertension Other   . Stroke Other     Social History   Tobacco Use  . Smoking status: Current Every Day Smoker    Packs/day: 0.30    Types: Cigarettes  . Smokeless tobacco: Never Used  Substance Use Topics  . Alcohol use: Yes    Comment: Occasional Drinker  . Drug use: Yes    Types: Marijuana    Home Medications Prior to Admission medications   Medication Sig Start Date End Date Taking? Authorizing Provider  albuterol (VENTOLIN HFA) 108 (90 Base) MCG/ACT inhaler Inhale 1-2 puffs into the lungs every 6 (six) hours as needed for wheezing or shortness of breath. 10/02/19   Malvin Johns, MD  benzonatate (TESSALON) 200 MG  capsule Take 1 capsule (200 mg total) by mouth 2 (two) times daily as needed for cough. Patient not taking: Reported on 07/30/2019 05/13/19   Eustace Moore, MD  benztropine (COGENTIN) 1 MG tablet Take 1 tablet (1 mg total) by mouth 2 (two) times daily. 10/02/19   Malvin Johns, MD  haloperidol (HALDOL) 20 MG tablet Take 1 tablet (20 mg total) by mouth at bedtime. 10/02/19   Malvin Johns, MD  temazepam (RESTORIL) 30 MG capsule Take 1 capsule (30 mg total) by mouth at bedtime. 10/02/19   Malvin Johns, MD    Allergies    Sudafed [pseudoephedrine]  Review of Systems   Review of Systems All other systems negative except as documented in the HPI. All pertinent positives and negatives as reviewed in the HPI.  Physical Exam Updated Vital Signs BP (!) 112/96 (BP Location: Left Arm)   Pulse 100   Temp 98.2 F (36.8 C) (Oral)   Resp 19   Ht 5\' 3"  (1.6 m)   Wt 104.3 kg   LMP 09/23/2019   SpO2 100%   BMI 40.74 kg/m   Physical Exam Vitals and nursing note reviewed.  Constitutional:      General: She is not in acute distress.    Appearance: She is well-developed.  HENT:     Head: Normocephalic and atraumatic.  Eyes:     Pupils: Pupils are equal, round,  and reactive to light.  Cardiovascular:     Rate and Rhythm: Normal rate and regular rhythm.  Pulmonary:     Effort: Pulmonary effort is normal.     Breath sounds: Normal breath sounds.  Skin:    General: Skin is warm and dry.  Neurological:     Mental Status: She is alert and oriented to person, place, and time.  Psychiatric:        Attention and Perception: She is inattentive.        Behavior: Behavior is agitated.        Thought Content: Thought content does not include homicidal or suicidal ideation. Thought content does not include homicidal or suicidal plan.     ED Results / Procedures / Treatments   Labs (all labs ordered are listed, but only abnormal results are displayed) Labs Reviewed - No data to  display  EKG None  Radiology No results found.  Procedures Procedures (including critical care time)  Medications Ordered in ED Medications - No data to display  ED Course  I have reviewed the triage vital signs and the nursing notes.  Pertinent labs & imaging results that were available during my care of the patient were reviewed by me and considered in my medical decision making (see chart for details).    MDM Rules/Calculators/A&P                     The patient at this time does not admit to any suicidality.  I have advised the patient she is continue to see her psychiatrist as scheduled tomorrow to obtain prescription that she is unsure of what the medication is.  She currently takes off whole host of medications for her psychiatric disorders.  I do not feel that filling this prescription today is 100% necessary and can be filled by her psychiatrist by going to their office today for another prescription copy. Final Clinical Impression(s) / ED Diagnoses Final diagnoses:  None    Rx / DC Orders ED Discharge Orders    None       Dalia Heading, PA-C 10/07/19 0756    Hayden Rasmussen, MD 10/07/19 [phone]

## 2019-10-07 NOTE — ED Notes (Signed)
When RN went into room to introduce self to patient she was walking around the room staring at the gloves. RN asked if patient needed something and patient states "that's a nice glove, can I have one? I want to work in the hospital. I'm tired of people seeing my fingernails because I haven't gotten them done recently". Will continue to monitor patient.

## 2019-10-07 NOTE — ED Triage Notes (Addendum)
Patient brought in by fiance.   Called for patient and patient was outside dancing in the rain.   Per patient he is her ex now since they never got married.   Patient reports she needs anxiety medications.   Patient reports she was a Transport planner today but threw out her prescriptions and fiance got mad at her and brought her here.  Patient ambulatory into ED.   LMP- light period 3 weeks ago and "the other day" patient reports spotting.    Hx. Schizophrenia

## 2019-10-07 NOTE — Discharge Instructions (Addendum)
You can go to [facility] as soon as you leave here to obtain another prescription of the medication.  Also you need to see your doctor tomorrow as a follow-up.

## 2019-10-08 ENCOUNTER — Ambulatory Visit (HOSPITAL_COMMUNITY)
Admission: RE | Admit: 2019-10-08 | Discharge: 2019-10-08 | Disposition: A | Discharge status: Home / Self Care | Payer: Federal, State, Local not specified - Other | Attending: Psychiatry | Admitting: Psychiatry

## 2019-11-11 ENCOUNTER — Ambulatory Visit (HOSPITAL_COMMUNITY)
Admission: AD | Admit: 2019-11-11 | Discharge: 2019-11-11 | Disposition: A | Discharge status: Home / Self Care | Payer: No Typology Code available for payment source | Attending: Psychiatry | Admitting: Psychiatry

## 2019-11-11 ENCOUNTER — Emergency Department (HOSPITAL_COMMUNITY)
Admission: EM | Admit: 2019-11-11 | Discharge: 2019-11-14 | Disposition: A | Discharge status: Other Acute Inpatient Hospital | Payer: Self-pay | Attending: Emergency Medicine | Admitting: Emergency Medicine

## 2019-11-11 DIAGNOSIS — Z046 Encounter for general psychiatric examination, requested by authority: Secondary | ICD-10-CM | POA: Insufficient documentation

## 2019-11-11 DIAGNOSIS — Z20822 Contact with and (suspected) exposure to covid-19: Secondary | ICD-10-CM | POA: Insufficient documentation

## 2019-11-11 DIAGNOSIS — R45851 Suicidal ideations: Secondary | ICD-10-CM

## 2019-11-11 DIAGNOSIS — R451 Restlessness and agitation: Secondary | ICD-10-CM

## 2019-11-11 DIAGNOSIS — F17210 Nicotine dependence, cigarettes, uncomplicated: Secondary | ICD-10-CM | POA: Insufficient documentation

## 2019-11-11 DIAGNOSIS — Z9114 Patient's other noncompliance with medication regimen: Secondary | ICD-10-CM | POA: Insufficient documentation

## 2019-11-11 DIAGNOSIS — F209 Schizophrenia, unspecified: Secondary | ICD-10-CM | POA: Diagnosis present

## 2019-11-11 LAB — RESPIRATORY PANEL BY RT PCR (FLU A&B, COVID)
Influenza A by PCR: NEGATIVE
Influenza B by PCR: NEGATIVE
SARS Coronavirus 2 by RT PCR: NEGATIVE

## 2019-11-11 LAB — BASIC METABOLIC PANEL
Anion gap: 5 (ref 5–15)
BUN: 12 mg/dL (ref 6–20)
CO2: 27 mmol/L (ref 22–32)
Calcium: 8.9 mg/dL (ref 8.9–10.3)
Chloride: 111 mmol/L (ref 98–111)
Creatinine, Ser: 0.72 mg/dL (ref 0.44–1.00)
GFR calc Af Amer: >60 mL/min (ref >60)
GFR calc non Af Amer: >60 mL/min (ref >60)
Glucose, Bld: 85 mg/dL (ref 70–99)
Potassium: 3.8 mmol/L (ref 3.5–5.1)
Sodium: 143 mmol/L (ref 135–145)

## 2019-11-11 LAB — I-STAT BETA HCG BLOOD, ED (MC, WL, AP ONLY): I-stat hCG, quantitative: <5 m[IU]/mL (ref <5)

## 2019-11-11 MED ORDER — ZIPRASIDONE MESYLATE 20 MG IM SOLR
20.0000 mg | Freq: Once | INTRAMUSCULAR | Status: AC
  Administered 2019-11-11: 20 mg via INTRAMUSCULAR
  Filled 2019-11-11: qty 20

## 2019-11-11 MED ORDER — STERILE WATER FOR INJECTION IJ SOLN
INTRAMUSCULAR | Status: AC
  Filled 2019-11-11: qty 10

## 2019-11-11 MED ORDER — LORAZEPAM 2 MG/ML IJ SOLN
2.0000 mg | Freq: Once | INTRAMUSCULAR | Status: AC
  Administered 2019-11-11: 2 mg via INTRAMUSCULAR
  Filled 2019-11-11: qty 1

## 2019-11-11 NOTE — Progress Notes (Signed)
Received Savannah [name] asleep in her bedin four point restraints with the sitter at the bedside. She contracted to be cooperative with the staff and the treatment plan at 2320 hrs and the restraints were removed. She allowed the technician to draw her blood and a urine cup is at the bedside. She was given fluids to drink. She drifted off to sleep and slept throughout the night. This morning she was cooperative with the EKG and changed into scrubs.

## 2019-11-11 NOTE — ED Triage Notes (Signed)
Pt arrived with [employer] loud and verbally aggressive.  Pt is under IVC for S/I and H/I.

## 2019-11-11 NOTE — ED Notes (Signed)
One gold one blue saved tube in main lab

## 2019-11-11 NOTE — ED Provider Notes (Signed)
[place] [facility] DEPT Provider Note   CSN: [id number] Arrival date & time: 11/11/19  1803     History Chief Complaint  Patient presents with  . Suicidal    CLAUDEAN LEAVELLE is a 34 y.o. female.  34 year old female with prior medical history as detailed below arrives with law enforcement on IVC.  Patient with IVC detailing that she is apparently suicidal and aggressive.  Patient is initially evaluated in the ED hallway.  She is loudly cursing and screaming at staff and at law enforcement.  She is agitated and uncooperative.  Patient is unwilling to provide history to this examiner.  She requires sedation and restraints for the safety of the staff.  The history is provided by the patient and medical records.  Mental Health Problem Presenting symptoms: aggressive behavior, agitation and suicidal threats   Patient accompanied by:  Law enforcement Degree of incapacity (severity):  Moderate Onset quality:  Unable to specify Timing:  Unable to specify Progression:  Unable to specify Treatment compliance:  Unable to specify      Past Medical History:  Diagnosis Date  . Depression   . Kidney infection     Patient Active Problem List   Diagnosis Date Noted  . Bipolar I disorder, current or most recent episode manic, with psychotic features ([place])   . Schizophrenia ([place]) 07/31/2019  . Psychosis ([place]) 09/28/2013  . Unspecified episodic mood disorder 09/27/2013    No past surgical history on file.   OB History   No obstetric history on file.     Family History  Problem Relation Age of Onset  . Diabetes Other   . Hypertension Other   . Stroke Other     Social History   Tobacco Use  . Smoking status: Current Every Day Smoker    Packs/day: 0.30    Types: Cigarettes  . Smokeless tobacco: Never Used  Substance Use Topics  . Alcohol use: Yes    Comment: Occasional Drinker  . Drug use: Yes    Types: Marijuana    Home Medications Prior to  Admission medications   Medication Sig Start Date End Date Taking? Authorizing Provider  albuterol (VENTOLIN HFA) 108 (90 Base) MCG/ACT inhaler Inhale 1-2 puffs into the lungs every 6 (six) hours as needed for wheezing or shortness of breath. 10/02/19   Johnn Hai, MD  benzonatate (TESSALON) 200 MG capsule Take 1 capsule (200 mg total) by mouth 2 (two) times daily as needed for cough. Patient not taking: Reported on 07/30/2019 05/13/19   Raylene Everts, MD  benztropine (COGENTIN) 1 MG tablet Take 1 tablet (1 mg total) by mouth 2 (two) times daily. 10/02/19   Johnn Hai, MD  haloperidol (HALDOL) 20 MG tablet Take 1 tablet (20 mg total) by mouth at bedtime. 10/02/19   Johnn Hai, MD  temazepam (RESTORIL) 30 MG capsule Take 1 capsule (30 mg total) by mouth at bedtime. 10/02/19   Johnn Hai, MD    Allergies    Sudafed [pseudoephedrine]  Review of Systems   Review of Systems  Unable to perform ROS: Other (uncooperative)  Psychiatric/Behavioral: Positive for agitation.    Physical Exam Updated Vital Signs BP (!) 125/99 (BP Location: Left Arm) Comment: pt woke up middle vital signs asking to remove restraints   Pulse 78   Temp 97.7 F (36.5 C) (Oral)   Resp 18   SpO2 98%   Physical Exam Vitals and nursing note reviewed.  Constitutional:      General:  She is not in acute distress.    Appearance: Normal appearance. She is well-developed.  HENT:     Head: Normocephalic and atraumatic.  Eyes:     Conjunctiva/sclera: Conjunctivae normal.     Pupils: Pupils are equal, round, and reactive to light.  Cardiovascular:     Rate and Rhythm: Normal rate and regular rhythm.     Heart sounds: Normal heart sounds.  Pulmonary:     Effort: Pulmonary effort is normal. No respiratory distress.     Breath sounds: Normal breath sounds.  Abdominal:     General: There is no distension.     Palpations: Abdomen is soft.     Tenderness: There is no abdominal tenderness.  Musculoskeletal:         General: No deformity. Normal range of motion.     Cervical back: Normal range of motion and neck supple.  Skin:    General: Skin is warm and dry.  Neurological:     Mental Status: She is alert and oriented to person, place, and time.     ED Results / Procedures / Treatments   Labs (all labs ordered are listed, but only abnormal results are displayed) Labs Reviewed  RESPIRATORY PANEL BY RT PCR (FLU A&B, COVID)  ETHANOL  CBC WITH DIFFERENTIAL/PLATELET  BASIC METABOLIC PANEL  ACETAMINOPHEN LEVEL  SALICYLATE LEVEL  I-STAT BETA HCG BLOOD, ED ([facility], [facility], [facility] ONLY)    EKG None  Radiology No results found.  Procedures Procedures (including critical care time)  Medications Ordered in ED Medications  sterile water (preservative free) injection (has no administration in time range)  LORazepam (ATIVAN) injection 2 mg (2 mg Intramuscular Given 11/11/19 1826)  ziprasidone (GEODON) injection 20 mg (20 mg Intramuscular Given 11/11/19 1826)    ED Course  I have reviewed the triage vital signs and the nursing notes.  Pertinent labs & imaging results that were available during my care of the patient were reviewed by me and considered in my medical decision making (see chart for details).    MDM Rules/Calculators/A&P                      MDM  Screen complete  DEAN WONDER was evaluated in Emergency Department on 11/11/2019 for the symptoms described in the history of present illness. She was evaluated in the context of the global COVID-19 pandemic, which necessitated consideration that the patient might be at risk for infection with the SARS-CoV-2 virus that causes COVID-19. Institutional protocols and algorithms that pertain to the evaluation of patients at risk for COVID-19 are in a state of rapid change based on information released by regulatory bodies including the CDC and federal and state organizations. These policies and algorithms were followed during the patient's care in the  ED.  Patient is presenting with IVC and with law enforcement escort.  She required chemical and physical restraints on arrival.  Patient will require psychiatric assessment.  She is medically clear at this time for further psychiatric assessment and treatment.  Final disposition dependent upon psychiatric plan of care.    Final Clinical Impression(s) / ED Diagnoses Final diagnoses:  Suicidal ideation  Agitation    Rx / DC Orders ED Discharge Orders    None       Wynetta Fines, MD 11/11/19 2159

## 2019-11-11 NOTE — BH Assessment (Signed)
Assessment Note  Savannah [name] is an 34 y.o. female that presents this date with [employer] and mental health crisis team with IVC. Per IVC patient was threatening self harm and has been experiencing a ongoing mental health crisis that has resulted in high risk behaviors to include: patient recently being jailed, walking out of the house without clothes on, damage to personal property and per mother unknown SA use that has caused patient to be psychotic. This Probation officer contacted patient's mother Savannah [name] [phone] who initiated IVC to gather collateral information. Mother reports that patient was incarcerated last night after leaving her residence and breaking into a unknown vehicle that she believed was hers. Patient was released earlier this date and since then has been making threats to family members and has become aggressive and agitated. Family believes she is a threat to herself and others. Patient currently resides with her partner of five years Savannah [name] [phone] who also provided collateral information confirming above events. Partner states that patient has been unstable for the last few months and has been receiving services from [facility] who assisted with medication management although patient has been non complaint. Partner stated she finally agreed to be seen at [facility] on 10/07/19 to request medication refills after she obtained medications from [facility] and threw them out.   Per that note of 10/07/19 Lawyer PA writes: "Patient presents to the emergency department requesting refills of the medications. Patient states that she just got out of [facility] and was brought here by her fianc who states that she freaked out and threw the prescription on the window. The patient states that she is unsure of what medication the prescription was for. Patient states she has an appointment tomorrow with her psychiatrist. She denies any suicidal homicidal ideations at this time. Patient was just released from  [facility] this morning."   Patient was also seen on 09/30/19 when she presented to [facility] at that time with similar symptoms. Patient met inpatient criteria at that time and was also seen on 08/09/19 per chart review when she presented with similar symptoms again. Per that event patient was noted to have a history of Schizophrenia and was at that time, receiving services from [facility] who assisted with medication management. It was unclear that date if patient had been complaint with any medication regimen.   This date patient is refusing to participate in the assessment and will not answer any questions. Patient is demanding to be released. Per notes, patient has a history of AVH although does not seem to be responding to internal stimuli this date. Patient also has a history of Cannabis use although it is unclear if patient is currently using that substance this date with UDS pending. Patient this date presents very disorganized and is demanding to leave. Patient per notes does not have a history of self harm or intent to harm others. Patient will not respond to orientation questions. Patient is very guarded and agitated. She does not appear delusional or responding to internal stimuli. Patient's speech is pressured and loud. Patient will not answer orientation questions or render history.   Case was staffed with Cobos MD and Rankin NP who recommended a inpatient admission for stabilization. Patient was transferred to [facility] by [employer] for medical clearance with ED staff notified.          Diagnosis: F29.0 Schizophrenia (per records)   Past Medical History:  Past Medical History:  Diagnosis Date  . Depression   . Kidney infection  No past surgical history on file.  Family History:  Family History  Problem Relation Age of Onset  . Diabetes Other   . Hypertension Other   . Stroke Other     Social History:  reports that she has been smoking cigarettes. She has been smoking about 0.30 packs per day.  She has never used smokeless tobacco. She reports current alcohol use. She reports current drug use. Drug: Marijuana.  Additional Social History:  Alcohol / Drug Use Pain Medications: See MAR Prescriptions: See MAR Over the Counter: See MAR History of alcohol / drug use?: Yes Longest period of sobriety (when/how long): UTA Negative Consequences of Use: (Denies) Withdrawal Symptoms: (Denies) Substance #1 Name of Substance 1: Cannabis per hx pt denies 1 - Age of First Use: UTA 1 - Amount (size/oz): UTA 1 - Frequency: UTA 1 - Duration: UTA 1 - Last Use / Amount: UTA UDS pending  CIWA:   COWS:    Allergies:  Allergies  Allergen Reactions  . Sudafed [Pseudoephedrine] Swelling    Swelling     Home Medications: (Not in a hospital admission)   OB/GYN Status:  No LMP recorded. (Menstrual status: Irregular Periods).  General Assessment Data Location of Assessment: [facility] Assessment Services TTS Assessment: In system Is this a Tele or Face-to-Face Assessment?: Face-to-Face Is this an Initial Assessment or a Re-assessment for this encounter?: Initial Assessment Patient Accompanied by:: Other([employer]) Language Other than English: No Living Arrangements: Other (Comment)(Partner) What gender do you identify as?: Female Marital status: Long term relationship Elwin Sleight name: Furber Pregnancy Status: Unable to assess Living Arrangements: Spouse/significant other Can pt return to current living arrangement?: Yes Admission Status: Involuntary Petitioner: Family member Is patient capable of signing voluntary admission?: Yes Referral Source: Other([employer]) Insurance type: SP  Medical Screening Exam ([place]) Medical Exam completed: (Pt sent for medical clearance)  Crisis Care Plan Living Arrangements: Spouse/significant other Legal Guardian: (NA) Name of Psychiatrist: None Name of Therapist: None  Education Status Is patient currently in school?: No Highest grade of school  patient has completed: GED Is the patient employed, unemployed or receiving disability?: Unemployed  Risk to self with the past 6 months Suicidal Ideation: No Has patient been a risk to self within the past 6 months prior to admission? : No Suicidal Intent: No Has patient had any suicidal intent within the past 6 months prior to admission? : No Is patient at risk for suicide?: No Suicidal Plan?: No Has patient had any suicidal plan within the past 6 months prior to admission? : No Access to Means: No What has been your use of drugs/alcohol within the last 12 months?: Past hx of cannabis use per hx Previous Attempts/Gestures: No How many times?: 0 Other Self Harm Risks: (Off medications high risk behaviors) Triggers for Past Attempts: (NA) Intentional Self Injurious Behavior: None Family Suicide History: No Recent stressful life event(s): Turmoil (Comment), Other (Comment)(Ongoing family issues med non compliance) Persecutory voices/beliefs?: No Depression: No Depression Symptoms: (Pt denies) Substance abuse history and/or treatment for substance abuse?: No Suicide prevention information given to non-admitted patients: Not applicable  Risk to Others within the past 6 months Homicidal Ideation: No Does patient have any lifetime risk of violence toward others beyond the six months prior to admission? : No Thoughts of Harm to Others: No Current Homicidal Intent: No Current Homicidal Plan: No Access to Homicidal Means: No Identified Victim: n History of harm to others?: No Assessment of Violence: None Noted Violent Behavior Description: n Does  patient have access to weapons?: No Criminal Charges Pending?: No Does patient have a court date: No Is patient on probation?: No  Psychosis Hallucinations: None noted Delusions: None noted  Mental Status Report Appearance/Hygiene: Unremarkable Eye Contact: Poor Motor Activity: Agitation Speech: Aggressive Level of Consciousness:  Irritable Mood: Angry Affect: Threatening Anxiety Level: Severe Thought Processes: Flight of Ideas Judgement: Partial Orientation: Unable to assess(Pt declines to answer) Obsessive Compulsive Thoughts/Behaviors: None  Cognitive Functioning Concentration: Unable to Assess Memory: Unable to Assess Is patient IDD: No Insight: Unable to Assess Impulse Control: Unable to Assess Appetite: (UTA) Have you had any weight changes? : (UTA) Sleep: (UTA) Total Hours of Sleep: (UTA) Vegetative Symptoms: (UTA)  ADLScreening [facility] Assessment Services) Patient's cognitive ability adequate to safely complete daily activities?: Yes Patient able to express need for assistance with ADLs?: Yes Independently performs ADLs?: Yes (appropriate for developmental age)  Prior Inpatient Therapy Prior Inpatient Therapy: Yes Prior Therapy Dates: 2021, 2020 Prior Therapy Facilty/Provider(s): [facility] Reason for Treatment: MH issues  Prior Outpatient Therapy Prior Outpatient Therapy: Yes Prior Therapy Dates: (Past hx with [facility] per notes ) Prior Therapy Facilty/Provider(s): [facility] Reason for Treatment: Med mang Does patient have an ACCT team?: Unknown(Past hx with [facility] unsure this date ) Does patient have Intensive In-House Services?  : No Does patient have [facility] services? : Unknown(Past hx) Does patient have P4CC services?: No  ADL Screening (condition at time of admission) Patient's cognitive ability adequate to safely complete daily activities?: Yes Is the patient deaf or have difficulty hearing?: No Does the patient have difficulty seeing, even when wearing glasses/contacts?: No Does the patient have difficulty concentrating, remembering, or making decisions?: No Patient able to express need for assistance with ADLs?: Yes Does the patient have difficulty dressing or bathing?: No Independently performs ADLs?: Yes (appropriate for developmental age) Does the patient have difficulty  walking or climbing stairs?: No Weakness of Legs: None Weakness of Arms/Hands: None  Home Assistive Devices/Equipment Home Assistive Devices/Equipment: None  Therapy Consults (therapy consults require a physician order) PT Evaluation Needed: No OT Evalulation Needed: No SLP Evaluation Needed: No Abuse/Neglect Assessment (Assessment to be complete while patient is alone) Physical Abuse: Denies Verbal Abuse: Denies Sexual Abuse: Denies Exploitation of patient/patient's resources: Denies Self-Neglect: Denies Values / Beliefs Cultural Requests During Hospitalization: None Spiritual Requests During Hospitalization: None Consults Spiritual Care Consult Needed: No Transition of Care Team Consult Needed: No Advance Directives (For Healthcare) Does Patient Have a Medical Advance Directive?: No Would patient like information on creating a medical advance directive?: No - Patient declined          Disposition: Case was staffed with Cobos MD and Rankin NP who recommended a inpatient admission for stabilization. Patient was transferred to [facility] by [employer] for medical clearance with ED staff notified.   Disposition Initial Assessment Completed for this Encounter: Yes Disposition of Patient: Admit Type of inpatient treatment program: Adult  On Site Evaluation by:   Reviewed with Physician:    Mamie Nick 11/11/2019 6:20 PM

## 2019-11-11 NOTE — BH Assessment (Signed)
[facility] Assessment Progress Note  Case was staffed with Cobos MD and Rankin NP who recommended a inpatient admission for stabilization. Patient was transferred to [facility] by [employer] for medical clearance with ED staff notified.

## 2019-11-11 NOTE — H&P (Signed)
Behavioral Health Medical Screening Exam  Savannah [name] is an 34 y.o. female patient presents to [facility] as a walk-in via law enforcement under IVC imitated by her mother with complaints that patient is a danger to herself and others.  Patient denies suicidal/self-harm/homicidal ideation, psychosis, and paranoia.  Collateral information gathered from mother of patient and boyfriend (for detailed note see Chesley Mires note.).  Both mother and boyfriend information was similar that patient disorganized, arrested last night for getting into someone else's car naked, going out side naked, ringing the doorbells of others, noncompliant with medication.     During evaluation Vondell L Rome is sitting upright in a chair; she is alert/oriented x 3; cooperative; but easily agitated about the IVC stating she was home in her bed when the police came and took her out of her house.  She has limited insight.  Patient is speaking in a clear tone but pressured.  with good eye contact.  Her thought process is disorganized.  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  But could be true.  Patient denies suicidal/self-harm/homicidal ideation, psychosis, and paranoia.  Recommended inpatient psychiatric treatment.  No appropriate bed at [facility]; will reassess patient in the morning.     Total Time spent with patient: 30 minutes  Psychiatric Specialty Exam: Physical Exam  Vitals reviewed. Constitutional: She is oriented to person, place, and time. She appears well-developed and well-nourished.  Respiratory: Effort normal.  Musculoskeletal:        General: Normal range of motion.     Cervical back: Normal range of motion.  Neurological: She is alert and oriented to person, place, and time.  Skin: Skin is warm and dry.  Psychiatric: Her affect is labile. Her speech is rapid and/or  pressured. She is agitated. Thought content is not delusional. She expresses impulsivity. She expresses no suicidal ideation.    Review of Systems  Psychiatric/Behavioral: Positive for agitation. Hallucinations: Denies. Sleep disturbance: States she is sleeping well. Suicidal ideas: Denies.  All other systems reviewed and are negative.   There were no vitals taken for this visit.There is no height or weight on file to calculate BMI.  General Appearance: Casual  Eye Contact:  Good  Speech:  Clear and Coherent and Pressured  Volume:  Normal  Mood:  Irritable  Affect:  Labile  Thought Process:  Coherent and Disorganized  Orientation:  Full (Time, Place, and Person)  Thought Content:  Patient denies hallucinations, delusions, and paranoia  Suicidal Thoughts:  No  Homicidal Thoughts:  No  Memory:  Immediate;   Fair Recent;   Fair  Judgement:  Fair  Insight:  Lacking  Psychomotor Activity:  Normal  Concentration: Concentration: Fair and Attention Span: Fair  Recall:  [employer] of Knowledge:Fair  Language: Fair  Akathisia:  No  Handed:  Right  AIMS (if indicated):     Assets:  Communication Skills Desire for Improvement Intimacy Social Support  Sleep:       Musculoskeletal: Strength & Muscle Tone: within normal limits Gait & Station: normal Patient leans: N/A  There were no vitals taken for this visit.  Patient refused vitals  Recommendations:  Inpatient psychiatric treatment.  Will reassess in morning if patient hasn't been accepted to psychiatric hospital  Based on my evaluation the patient does not appear to have an emergency medical condition.  Patient sent to [facility]  for medical clearance related to no appropriate bed at [facility].  Spoke with Dr. Francia Greaves informed patient being transferred to [facility] and would reassess patient in morning if not psychiatric hospitalization.    Freeda Spivey, NP 11/11/2019, 5:32 PM

## 2019-11-11 NOTE — [facility] Counselor (Signed)
Pt was assessed as a walk-in at [facility], another assessment is not needed.    Redmond Pulling, MS, [facility], [facility] Triage Specialist [phone]

## 2019-11-12 ENCOUNTER — Encounter (HOSPITAL_COMMUNITY): Payer: Self-pay | Admitting: Registered Nurse

## 2019-11-12 DIAGNOSIS — R45851 Suicidal ideations: Secondary | ICD-10-CM

## 2019-11-12 DIAGNOSIS — R451 Restlessness and agitation: Secondary | ICD-10-CM

## 2019-11-12 LAB — URINALYSIS, ROUTINE W REFLEX MICROSCOPIC
Bilirubin Urine: NEGATIVE
Glucose, UA: NEGATIVE mg/dL
Hgb urine dipstick: NEGATIVE
Ketones, ur: 5 mg/dL — AB
Nitrite: POSITIVE — AB
Protein, ur: NEGATIVE mg/dL
Specific Gravity, Urine: 1.024 (ref 1.005–1.030)
pH: 5 (ref 5.0–8.0)

## 2019-11-12 LAB — CBC WITH DIFFERENTIAL/PLATELET
Abs Immature Granulocytes: 0.02 10*3/uL (ref 0.00–0.07)
Basophils Absolute: 0 10*3/uL (ref 0.0–0.1)
Basophils Relative: 1 %
Eosinophils Absolute: 0.2 10*3/uL (ref 0.0–0.5)
Eosinophils Relative: 3 %
HCT: 44.2 % (ref 36.0–46.0)
Hemoglobin: 13.5 g/dL (ref 12.0–15.0)
Immature Granulocytes: 0 %
Lymphocytes Relative: 45 %
Lymphs Abs: 2.8 10*3/uL (ref 0.7–4.0)
MCH: 26.4 pg (ref 26.0–34.0)
MCHC: 30.5 g/dL (ref 30.0–36.0)
MCV: 86.3 fL (ref 80.0–100.0)
Monocytes Absolute: 0.5 10*3/uL (ref 0.1–1.0)
Monocytes Relative: 8 %
Neutro Abs: 2.7 10*3/uL (ref 1.7–7.7)
Neutrophils Relative %: 43 %
Platelets: 239 10*3/uL (ref 150–400)
RBC: 5.12 MIL/uL — ABNORMAL HIGH (ref 3.87–5.11)
RDW: 17.2 % — ABNORMAL HIGH (ref 11.5–15.5)
WBC: 6.3 10*3/uL (ref 4.0–10.5)
nRBC: 0 % (ref 0.0–0.2)

## 2019-11-12 LAB — ETHANOL: Alcohol, Ethyl (B): <10 mg/dL (ref <10)

## 2019-11-12 LAB — RAPID URINE DRUG SCREEN, HOSP PERFORMED
Amphetamines: NOT DETECTED
Barbiturates: NOT DETECTED
Benzodiazepines: NOT DETECTED
Cocaine: NOT DETECTED
Opiates: NOT DETECTED
Tetrahydrocannabinol: POSITIVE — AB

## 2019-11-12 LAB — SALICYLATE LEVEL: Salicylate Lvl: <7 mg/dL — ABNORMAL LOW (ref 7.0–30.0)

## 2019-11-12 LAB — ACETAMINOPHEN LEVEL: Acetaminophen (Tylenol), Serum: <10 ug/mL — ABNORMAL LOW (ref 10–30)

## 2019-11-12 MED ORDER — HALOPERIDOL 1 MG PO TABS
2.0000 mg | ORAL_TABLET | Freq: Three times a day (TID) | ORAL | Status: DC
  Administered 2019-11-12 – 2019-11-14 (×6): 2 mg via ORAL
  Filled 2019-11-12 (×7): qty 2

## 2019-11-12 MED ORDER — HALOPERIDOL LACTATE 5 MG/ML IJ SOLN
2.0000 mg | Freq: Two times a day (BID) | INTRAMUSCULAR | Status: DC | PRN
  Filled 2019-11-12: qty 1

## 2019-11-12 MED ORDER — TEMAZEPAM 15 MG PO CAPS
15.0000 mg | ORAL_CAPSULE | Freq: Every day | ORAL | Status: DC
  Administered 2019-11-12 – 2019-11-13 (×2): 15 mg via ORAL
  Filled 2019-11-12 (×2): qty 1

## 2019-11-12 MED ORDER — BENZTROPINE MESYLATE 0.5 MG PO TABS
0.5000 mg | ORAL_TABLET | Freq: Two times a day (BID) | ORAL | Status: DC
  Administered 2019-11-12 – 2019-11-14 (×5): 0.5 mg via ORAL
  Filled 2019-11-12 (×6): qty 1

## 2019-11-12 MED ORDER — LORAZEPAM 2 MG/ML IJ SOLN
2.0000 mg | Freq: Two times a day (BID) | INTRAMUSCULAR | Status: DC | PRN
  Filled 2019-11-12: qty 1

## 2019-11-12 MED ORDER — FLUCONAZOLE 150 MG PO TABS
150.0000 mg | ORAL_TABLET | Freq: Once | ORAL | Status: AC
  Administered 2019-11-12: 150 mg via ORAL
  Filled 2019-11-12: qty 1

## 2019-11-12 MED ORDER — NITROFURANTOIN MONOHYD MACRO 100 MG PO CAPS
100.0000 mg | ORAL_CAPSULE | Freq: Two times a day (BID) | ORAL | Status: AC
  Administered 2019-11-12 – 2019-11-14 (×5): 100 mg via ORAL
  Filled 2019-11-12 (×6): qty 1

## 2019-11-12 MED ORDER — DIPHENHYDRAMINE HCL 50 MG/ML IJ SOLN
50.0000 mg | Freq: Two times a day (BID) | INTRAMUSCULAR | Status: DC | PRN
  Filled 2019-11-12: qty 1

## 2019-11-12 NOTE — ED Notes (Signed)
Pt resting in bed comfortably. Pt irritable, asking about leaving, pt reassured staff working on the process.

## 2019-11-12 NOTE — ED Notes (Signed)
Pt resting in bed.  Pt having delusional thoughts. Pt upset, threatening, cursing, rambling.
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
## 2019-11-12 NOTE — ED Notes (Signed)
Pt call 911.

## 2019-11-12 NOTE — BH Assessment (Signed)
[facility] Assessment Progress Note  Per Shuvon Rankin, FNP, this pt requires psychiatric hospitalization at this time.  Pt presents under IVC initiated by pt's mother and upheld by EDP Kristine Royal, MD.  The following facilities have been contacted to seek placement for this pt, with results as noted:  Beds available, information sent, decision pending: [facility] Ebbie Latus [place]  At capacity: [facility]   Doylene Canning, [place] [employer] [facility] Coordinator [phone]

## 2019-11-12 NOTE — ED Notes (Signed)
Pt became agitated about not being able to leave. Pt was threatening, yelling, loud, delusional. Pt difficult to redirect, pt did take PO medication with encouragement.

## 2019-11-13 NOTE — ED Notes (Signed)
Pt cooperative with medication this afternoon.  Pt calm, cooperative, able to speak and answer questions appropriately. Pt took a shower. Pt resting comfortably

## 2019-11-13 NOTE — ED Notes (Signed)
Introduced self to her and informed her she was IVC because she said she was ready to leave. Explained to her she couldn't leave till the Dr discharged her which the dr has not. She was initially very pleasant on approach but her demeanor quickly changed when she was told she would not be leaving tonight. She asked me to leave her room but to give her towels first. She was set up to shower and did. She is labile but she calmed self down.

## 2019-11-13 NOTE — ED Notes (Signed)
Pt agitated. Pt upset. Upset talking on the phone.

## 2019-11-13 NOTE — ED Notes (Signed)
Pt in room.  Calm.

## 2019-11-13 NOTE — ED Notes (Signed)
Pt to room 39. Cooperative with move. Pt reassured would talk to provider this AM.
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
## 2019-11-13 NOTE — ED Notes (Signed)
Pt took PO medication with encouragement. Pt labile.

## 2019-11-13 NOTE — ED Notes (Signed)
Pt sleeping, resting comfortably

## 2019-11-14 ENCOUNTER — Inpatient Hospital Stay
Admission: AD | Admit: 2019-11-14 | Discharge: 2019-11-18 | DRG: 885 | Disposition: A | Discharge status: Home / Self Care | Payer: No Typology Code available for payment source | Source: Intra-hospital | Attending: Psychiatry | Admitting: Psychiatry

## 2019-11-14 ENCOUNTER — Other Ambulatory Visit: Payer: Self-pay

## 2019-11-14 DIAGNOSIS — F319 Bipolar disorder, unspecified: Secondary | ICD-10-CM | POA: Diagnosis present

## 2019-11-14 DIAGNOSIS — Z9119 Patient's noncompliance with other medical treatment and regimen: Secondary | ICD-10-CM

## 2019-11-14 DIAGNOSIS — F209 Schizophrenia, unspecified: Principal | ICD-10-CM | POA: Diagnosis present

## 2019-11-14 DIAGNOSIS — Z833 Family history of diabetes mellitus: Secondary | ICD-10-CM | POA: Diagnosis not present

## 2019-11-14 DIAGNOSIS — Z62810 Personal history of physical and sexual abuse in childhood: Secondary | ICD-10-CM | POA: Diagnosis present

## 2019-11-14 DIAGNOSIS — F1290 Cannabis use, unspecified, uncomplicated: Secondary | ICD-10-CM | POA: Diagnosis present

## 2019-11-14 DIAGNOSIS — Z79899 Other long term (current) drug therapy: Secondary | ICD-10-CM | POA: Diagnosis not present

## 2019-11-14 DIAGNOSIS — F17210 Nicotine dependence, cigarettes, uncomplicated: Secondary | ICD-10-CM | POA: Diagnosis present

## 2019-11-14 DIAGNOSIS — Z888 Allergy status to other drugs, medicaments and biological substances status: Secondary | ICD-10-CM

## 2019-11-14 DIAGNOSIS — Z9114 Patient's other noncompliance with medication regimen: Secondary | ICD-10-CM | POA: Diagnosis not present

## 2019-11-14 DIAGNOSIS — Z8249 Family history of ischemic heart disease and other diseases of the circulatory system: Secondary | ICD-10-CM | POA: Diagnosis not present

## 2019-11-14 DIAGNOSIS — F203 Undifferentiated schizophrenia: Secondary | ICD-10-CM | POA: Diagnosis not present

## 2019-11-14 DIAGNOSIS — Z823 Family history of stroke: Secondary | ICD-10-CM

## 2019-11-14 DIAGNOSIS — F419 Anxiety disorder, unspecified: Secondary | ICD-10-CM | POA: Diagnosis present

## 2019-11-14 DIAGNOSIS — F200 Paranoid schizophrenia: Secondary | ICD-10-CM

## 2019-11-14 MED ORDER — HALOPERIDOL 5 MG PO TABS
5.0000 mg | ORAL_TABLET | Freq: Four times a day (QID) | ORAL | Status: DC | PRN
  Administered 2019-11-16: 5 mg via ORAL
  Filled 2019-11-14: qty 1

## 2019-11-14 MED ORDER — DIPHENHYDRAMINE HCL 25 MG PO CAPS: 50.0000 mg | ORAL_CAPSULE | Freq: Four times a day (QID) | ORAL | Status: DC | PRN

## 2019-11-14 MED ORDER — ACETAMINOPHEN 325 MG PO TABS: 650.0000 mg | ORAL_TABLET | Freq: Four times a day (QID) | ORAL | Status: DC | PRN

## 2019-11-14 MED ORDER — LORAZEPAM 2 MG/ML IJ SOLN: 2.0000 mg | Freq: Four times a day (QID) | INTRAMUSCULAR | Status: DC | PRN

## 2019-11-14 MED ORDER — DIPHENHYDRAMINE HCL 50 MG/ML IJ SOLN: 50.0000 mg | Freq: Four times a day (QID) | INTRAMUSCULAR | Status: DC | PRN

## 2019-11-14 MED ORDER — ALUM & MAG HYDROXIDE-SIMETH 200-200-20 MG/5ML PO SUSP: 30.0000 mL | ORAL | Status: DC | PRN

## 2019-11-14 MED ORDER — MAGNESIUM HYDROXIDE 400 MG/5ML PO SUSP: 30.0000 mL | Freq: Every day | ORAL | Status: DC | PRN

## 2019-11-14 MED ORDER — HALOPERIDOL LACTATE 5 MG/ML IJ SOLN: 5.0000 mg | Freq: Four times a day (QID) | INTRAMUSCULAR | Status: DC | PRN

## 2019-11-14 MED ORDER — HYDROXYZINE HCL 25 MG PO TABS: 25.0000 mg | ORAL_TABLET | Freq: Three times a day (TID) | ORAL | Status: DC | PRN

## 2019-11-14 MED ORDER — TRAZODONE HCL 50 MG PO TABS
50.0000 mg | ORAL_TABLET | Freq: Every evening | ORAL | Status: DC | PRN
  Administered 2019-11-14 – 2019-11-16 (×3): 50 mg via ORAL
  Filled 2019-11-14 (×3): qty 1

## 2019-11-14 MED ORDER — LORAZEPAM 2 MG PO TABS
2.0000 mg | ORAL_TABLET | Freq: Four times a day (QID) | ORAL | Status: DC | PRN
  Administered 2019-11-14 – 2019-11-16 (×3): 2 mg via ORAL
  Filled 2019-11-14 (×3): qty 1

## 2019-11-14 NOTE — BH Assessment (Signed)
Patient has been accepted to [facility]  Accepted by Nurse Practitioner Reola Calkins, on the behalf of Dr. Lucianne Muss..  Attending  Physician will be Dr. Toni Amend.  Patient has been assigned to room 323, by [facility] Charge Nurse Megan   Call report to [phone].  Representative/Transfer Coordinator is Deondrae Mcgrail Patient pre-admitted by [facility] Patient Access Hurley Cisco)   [facility] ER Staff Maisie Fus H, Disposition TTS) made aware of acceptance.
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
## 2019-11-14 NOTE — Plan of Care (Signed)
New admission  Problem: Education: Goal: Knowledge of [facility] Education information/materials will improve Outcome: Not Progressing Goal: Emotional status will improve Outcome: Not Progressing Goal: Mental status will improve Outcome: Not Progressing Goal: Verbalization of understanding the information provided will improve Outcome: Not Progressing   Problem: Education: Goal: Will be free of psychotic symptoms Outcome: Not Progressing Goal: Knowledge of the prescribed therapeutic regimen will improve Outcome: Not Progressing   Problem: Coping: Goal: Coping ability will improve Outcome: Not Progressing Goal: Will verbalize feelings Outcome: Not Progressing   Problem: Health Behavior/Discharge Planning: Goal: Compliance with prescribed medication regimen will improve Outcome: Not Progressing   Problem: Nutritional: Goal: Ability to achieve adequate nutritional intake will improve Outcome: Not Progressing   Problem: Role Relationship: Goal: Ability to communicate needs accurately will improve Outcome: Not Progressing Goal: Ability to interact with others will improve Outcome: Not Progressing   Problem: Safety: Goal: Ability to redirect hostility and anger into socially appropriate behaviors will improve Outcome: Not Progressing Goal: Ability to remain free from injury will improve Outcome: Not Progressing   Problem: Self-Care: Goal: Ability to participate in self-care as condition permits will improve Outcome: Not Progressing   Problem: Self-Concept: Goal: Will verbalize positive feelings about self Outcome: Not Progressing

## 2019-11-14 NOTE — Consult Note (Addendum)
Savannah [name]  11/14/2019 12:33 PM Savannah [name]  MRN:  [id number] Subjective:  "I need to get out of here so I can do my school work.  I do not have a mental health issue".   Principal Problem: Schizophrenia ([place]) Diagnosis:  Principal Problem:   Schizophrenia [facility])   Chart reviewed and patient seen via telepsych by this provider and Dr. Dwyane Dee on 11/14/19.  On evaluation Savannah [name] is seen singing and dancing in her room.  She greets the mental health team with "hi".  She is very talkative, still disorganized and demonstrates difficulty in communicating a coherent thought.  During the assessment she ruminates about school and her need for discharge to continue with her studies.  She provides unsolicited information about various unrelated topics to include her [place] experiences and "why I am not crazy" and her employability and jobs skills.    Total Time spent with patient: 30 minutes  Past Psychiatric History: Schizophrenia  Past Medical History:  Past Medical History:  Diagnosis Date  . Depression   . Kidney infection    History reviewed. No pertinent surgical history. Family History:  Family History  Problem Relation Age of Onset  . Diabetes Other   . Hypertension Other   . Stroke Other    Family Psychiatric  History: unknown Social History:  Social History   Substance and Sexual Activity  Alcohol Use Yes   Comment: Occasional Drinker     Social History   Substance and Sexual Activity  Drug Use Yes  . Types: Marijuana    Social History   Socioeconomic History  . Marital status: Single    Spouse name: Not on file  . Number of children: Not on file  . Years of education: Not on file  . Highest education level: Not on file  Occupational History  . Not on file  Tobacco Use  . Smoking status: Current Every Day Smoker    Packs/day: 0.30    Types: Cigarettes  . Smokeless tobacco: Never Used  Substance and Sexual Activity  . Alcohol use:  Yes    Comment: Occasional Drinker  . Drug use: Yes    Types: Marijuana  . Sexual activity: Yes    Birth control/protection: None  Other Topics Concern  . Not on file  Social History Narrative   Pt states she lives alone in an apartment but has a fiance, [name], who brought her in to [facility] due to [place].   Social Determinants of Health   Financial Resource Strain:   . Difficulty of Paying Living Expenses:   Food Insecurity:   . Worried About Charity fundraiser in the Last Year:   . Arboriculturist in the Last Year:   Transportation Needs:   . Film/video editor (Medical):   Marland Kitchen Lack of Transportation (Non-Medical):   Physical Activity:   . Days of Exercise per Week:   . Minutes of Exercise per Session:   Stress:   . Feeling of Stress :   Social Connections:   . Frequency of Communication with Friends and Family:   . Frequency of Social Gatherings with Friends and Family:   . Attends Religious Services:   . Active Member of Clubs or Organizations:   . Attends Archivist Meetings:   Marland Kitchen Marital Status:     Sleep: Good  Appetite:  Good  Current Medications: Current Facility-Administered Medications  Medication Dose Route Frequency Provider Last Rate Last  Admin  . benztropine (COGENTIN) tablet 0.5 mg  0.5 mg Oral BID Rankin, Shuvon B, NP   0.5 mg at 11/14/19 0945  . haloperidol lactate (HALDOL) injection 2 mg  2 mg Intramuscular BID PRN Rankin, Shuvon B, NP       And  . LORazepam (ATIVAN) injection 2 mg  2 mg Intramuscular BID PRN Rankin, Shuvon B, NP       And  . diphenhydrAMINE (BENADRYL) injection 50 mg  50 mg Intramuscular BID PRN Rankin, Shuvon B, NP      . haloperidol (HALDOL) tablet 2 mg  2 mg Oral TID Rankin, Shuvon B, NP   2 mg at 11/14/19 0945  . temazepam (RESTORIL) capsule 15 mg  15 mg Oral QHS Rankin, Shuvon B, NP   15 mg at 11/13/19 2123   Current Outpatient Medications  Medication Sig Dispense Refill  . albuterol (VENTOLIN HFA) 108 (90  Base) MCG/ACT inhaler Inhale 1-2 puffs into the lungs every 6 (six) hours as needed for wheezing or shortness of breath. 18 g 0  . benzonatate (TESSALON) 200 MG capsule Take 1 capsule (200 mg total) by mouth 2 (two) times daily as needed for cough. (Patient not taking: Reported on 07/30/2019) 20 capsule 0  . benztropine (COGENTIN) 1 MG tablet Take 1 tablet (1 mg total) by mouth 2 (two) times daily. 60 tablet 2  . haloperidol (HALDOL) 20 MG tablet Take 1 tablet (20 mg total) by mouth at bedtime. 30 tablet 5  . temazepam (RESTORIL) 30 MG capsule Take 1 capsule (30 mg total) by mouth at bedtime. 30 capsule 0    Lab Results: No results found for this or any previous visit (from the past 48 hour(s)).  Blood Alcohol level:  Lab Results  Component Value Date   ETH <10 11/11/2019   ETH <10 10/01/2019    Physical Findings: AIMS:  , ,  ,  ,    CIWA:    COWS:     Musculoskeletal: Strength & Muscle Tone: within normal limits Gait & Station: normal Patient leans: N/A  Psychiatric Specialty Exam: Physical Exam Neurological:     Mental Status: She is alert.     Review of Systems  Psychiatric/Behavioral: Positive for decreased concentration. Negative for hallucinations and suicidal ideas. The patient is hyperactive.     Blood pressure 103/63, pulse 61, temperature 98.2 F (36.8 C), temperature source Oral, resp. rate 18, SpO2 97 %.There is no height or weight on file to calculate BMI.  General Appearance: Bizarre and patient standing very close to the TV dancing. very talkative.  Eye Contact:  Good  Speech:  Pressured and but improving  Volume:  Normal  Mood:  Euphoric  Affect:  Non-Congruent, Inappropriate and Restricted  Thought Process:  Disorganized and Descriptions of Associations: Loose  Orientation:  Full (Time, Place, and Person)  Thought Content:  Paranoid Ideation and Rumination  Suicidal Thoughts:  No  Homicidal Thoughts:  No  Memory:  Immediate;   Fair Recent;   Fair   Judgement:  Impaired  Insight:  Lacking  Psychomotor Activity:  Normal  Concentration:  Concentration: Fair and Attention Span: Fair  Recall:  [employer] of Knowledge:  Fair  Language:  Fair  Akathisia:  NA  Handed:  Right  AIMS (if indicated):     Assets:  Communication Skills Desire for Improvement Housing Social Support  ADL's:  Intact  Cognition:  Impaired,  Mild  Sleep:  Treatment Plan Summary: She is more clear today and less intrusive since taking her medications but still presents with safety concerns related to her disorganized speech and impaired judgement.  She continues to meet inpatient criteria and has been accepted at [facility] regional.  SW to facilitate transfer details.   Daily contact with patient to assess and evaluate symptoms and progress in treatment and Medication management: continue as outlined above.   Spoke with Dr. Richrd Humbles, EDP, informed of above recommendation and disposition   Chales Abrahams, NP 11/14/2019, 12:33 PM

## 2019-11-14 NOTE — BHH Group Notes (Signed)
BHH Group Notes:  (Nursing/MHT/Case Management/Adjunct)  Date:  11/14/2019  Time:  8:57 PM  Type of Therapy:  Group Therapy  Participation Level:  Active  Participation Quality:  Appropriate  Affect:  Appropriate  Cognitive:  Alert and Appropriate  Insight:  Good  Engagement in Group:  Engaged and Talk about her feeling.  Modes of Intervention:  Support  Summary of Progress/Problems:  Savannah [name] 11/14/2019, 8:57 PM
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
## 2019-11-14 NOTE — BH Assessment (Signed)
BHH Assessment Progress Note  Per Nelly Rout, MD, this pt continues to require psychiatric hospitalization.  Robinette Haines, Counselor reports that pt has been accepted to [facility] by [employer], FNP to Rm 323.  Pt presents under IVC initiated by pt's mother, and upheld by EDP Kristine Royal, MD, and IVC documents have been faxed to [phone].  Pt's nurse, Kendal Hymen, has been notified, and agrees to call report to [phone].  Pt is to be transported via [facility].   Doylene Canning, [place] Behavioral Health Coordinator [phone]

## 2019-11-15 DIAGNOSIS — F203 Undifferentiated schizophrenia: Secondary | ICD-10-CM

## 2019-11-15 MED ORDER — ALBUTEROL SULFATE HFA 108 (90 BASE) MCG/ACT IN AERS
2.0000 | INHALATION_SPRAY | Freq: Four times a day (QID) | RESPIRATORY_TRACT | Status: DC | PRN
  Filled 2019-11-15: qty 6.7

## 2019-11-15 MED ORDER — HALOPERIDOL 5 MG PO TABS
20.0000 mg | ORAL_TABLET | Freq: Every day | ORAL | Status: DC
  Administered 2019-11-15 – 2019-11-16 (×2): 20 mg via ORAL
  Filled 2019-11-15 (×2): qty 4

## 2019-11-15 MED ORDER — BENZTROPINE MESYLATE 1 MG PO TABS
1.0000 mg | ORAL_TABLET | Freq: Two times a day (BID) | ORAL | Status: DC
  Administered 2019-11-15 – 2019-11-18 (×6): 1 mg via ORAL
  Filled 2019-11-15 (×6): qty 1

## 2019-11-15 NOTE — Plan of Care (Signed)
  Problem: Activity: Goal: Will verbalize the importance of balancing activity with adequate rest periods Outcome: Not Progressing  Patients not acting appropriately with activity and rest period.

## 2019-11-15 NOTE — Progress Notes (Signed)
Patient is found in another patients room.Patient states " I am in my mothers room.I am letting her to try my cloths." Patient states " I am not a patient.I am with my mother." Patient is redirected to her room and told not to go to any other patients room.Patient is receptive to that.

## 2019-11-15 NOTE — BHH Suicide Risk Assessment (Signed)
BHH INPATIENT:  Family/Significant Other Suicide Prevention Education  Suicide Prevention Education:  Education Completed; Savannah [name], fiance, [phone] has been identified by the patient as the family member/significant other with whom the patient will be residing, and identified as the person(s) who will aid the patient in the event of a mental health crisis (suicidal ideations/suicide attempt).  With written consent from the patient, the family member/significant other has been provided the following suicide prevention education, prior to the and/or following the discharge of the patient.  The suicide prevention education provided includes the following:  Suicide risk factors  Suicide prevention and interventions  National Suicide Hotline telephone number  [facility] assessment telephone number  [facility] Emergency Assistance 911  [facility] and/or Residential Mobile Crisis Unit telephone number  Request made of family/significant other to:  Remove weapons (e.g., guns, rifles, knives), all items previously/currently identified as safety concern.    Remove drugs/medications (over-the-counter, prescriptions, illicit drugs), all items previously/currently identified as a safety concern.  The family member/significant other verbalizes understanding of the suicide prevention education information provided.  The family member/significant other agrees to remove the items of safety concern listed above.  Savannah [name] 11/15/2019, 11:20 AM

## 2019-11-15 NOTE — BHH Group Notes (Signed)
BHH Group Notes:  (Nursing/MHT/Case Management/Adjunct)  Date:  11/15/2019  Time:  9:21 AM  Type of Therapy:  Community Meeting  Participation Level:  Minimal  Participation Quality:  Appropriate  Affect:  Appropriate  Cognitive:  Appropriate  Insight:  Appropriate  Engagement in Group:  Poor  Modes of Intervention:  Discussion, Education and Support  Summary of Progress/Problems: Patient left group early.  Lynelle Smoke [facility] 11/15/2019, 9:21 AM

## 2019-11-15 NOTE — Progress Notes (Signed)
Recreation Therapy Notes  Date: 11/15/2019  Time: 9:30 am  Location: Craft Room  Behavioral response: Appropriate   Intervention Topic: Communication    Discussion/Intervention:  Group content today was focused on communication. The group defined communication and ways to communicate with others. Individuals stated reason why communication is important and some reasons to communicate with others. Patients expressed if they thought they were good at communicating with others and ways they could improve their communication skills. The group identified important parts of communication and some experiences they have had in the past with communication. The group participated in the intervention "Words in a Bag", where they had a chance to test out their communication skills and identify ways to improve their communication techniques.  Clinical Observations/Feedback:  Patient came to group late due to unknown reasons. She expressed that the most important part of communication is understanding,volume levels and staying focused. Participant explained that communication is important to let others know your needs. Patient was pulled from group by Social work to complete assessment.     Greysen Swanton LRT/CTRS            Navi Erber 11/15/2019 12:25 PM

## 2019-11-15 NOTE — Plan of Care (Signed)
Patient is pleasant and cooperative on approach.Denies SI,HI and AVH.Patient stated that her goal is take the right medications and be with her family.Patient is visible in the milieu.Appropriate with staff & peers.Compliant with medications.Attended groups.Appetite and energy level good.Support and encouragement given.
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
## 2019-11-15 NOTE — BHH Suicide Risk Assessment (Signed)
[facility] Admission Suicide Risk Assessment   Nursing information obtained from:  Patient Demographic factors:  NA Current Mental Status:  NA Loss Factors:  NA Historical Factors:  NA Risk Reduction Factors:  Employed, Living with another person, especially a relative, Positive social support  Total Time spent with patient: 1 hour Principal Problem: Schizophrenia ([place]) Diagnosis:  Principal Problem:   Schizophrenia ([place])  Subjective Data: Patient seen and chart reviewed.  34 year old woman with a history of schizophrenia brought to the hospital because of ongoing psychosis medicine noncompliance and bizarre behavior at home  Continued Clinical Symptoms:  Alcohol Use Disorder Identification Test Final Score (AUDIT): 2 The "Alcohol Use Disorders Identification Test", Guidelines for Use in Primary Care, Second Edition.  World Pharmacologist [facility] Score between 0-7:  no or low risk or alcohol related problems. Score between 8-15:  moderate risk of alcohol related problems. Score between 16-19:  high risk of alcohol related problems. Score 20 or above:  warrants further diagnostic evaluation for alcohol dependence and treatment.   CLINICAL FACTORS:   Schizophrenia:   Paranoid or undifferentiated type   Musculoskeletal: Strength & Muscle Tone: within normal limits Gait & Station: normal Patient leans: N/A  Psychiatric Specialty Exam: Physical Exam  Nursing note and vitals reviewed. Constitutional: She appears well-developed and well-nourished.  HENT:  Head: Normocephalic and atraumatic.  Eyes: Pupils are equal, round, and reactive to light. Conjunctivae are normal.  Cardiovascular: Regular rhythm and normal heart sounds.  Respiratory: Effort normal.  GI: Soft.  Musculoskeletal:        General: Normal range of motion.     Cervical back: Normal range of motion.  Neurological: She is alert.  Skin: Skin is warm and dry.  Psychiatric: Her affect is blunt and inappropriate. Her  speech is tangential. She is agitated. She is not aggressive and not hyperactive. Thought content is paranoid and delusional. Cognition and memory are impaired. She expresses inappropriate judgment. She expresses no homicidal and no suicidal ideation.    Review of Systems  Constitutional: Negative.   HENT: Negative.   Eyes: Negative.   Respiratory: Negative.   Cardiovascular: Negative.   Gastrointestinal: Negative.   Musculoskeletal: Negative.   Skin: Negative.   Neurological: Negative.   Psychiatric/Behavioral: The patient is nervous/anxious.     Blood pressure 121/79, pulse 68, temperature 98.3 F (36.8 C), temperature source Oral, resp. rate 18, height 5\' 2"  (1.575 m), weight 104.3 kg, SpO2 100 %.Body mass index is 42.07 kg/m.  General Appearance: Casual  Eye Contact:  Good  Speech:  Normal Rate  Volume:  Normal  Mood:  Euthymic  Affect:  Constricted  Thought Process:  Disorganized  Orientation:  Negative  Thought Content:  Illogical, Delusions and Paranoid Ideation  Suicidal Thoughts:  No  Homicidal Thoughts:  No  Memory:  Immediate;   Fair Recent;   Poor Remote;   Fair  Judgement:  Impaired  Insight:  Shallow  Psychomotor Activity:  Decreased  Concentration:  Concentration: Poor  Recall:  Poor  Fund of Knowledge:  Fair  Language:  Fair  Akathisia:  No  Handed:  Right  AIMS (if indicated):     Assets:  Housing Physical Health Resilience Social Support  ADL's:  Impaired  Cognition:  Impaired,  Mild  Sleep:  Number of Hours: 6      COGNITIVE FEATURES THAT CONTRIBUTE TO RISK:  Closed-mindedness    SUICIDE RISK:   Minimal: No identifiable suicidal ideation.  Patients presenting with no risk factors but with  morbid ruminations; may be classified as minimal risk based on the severity of the depressive symptoms  PLAN OF CARE: Patient will be kept on 15-minute checks.  She will be restarted on Haldol consistent with previous treatment.  She will be engaged in  individual and group therapy.  We will hope to aim for long-acting injectable medicine before discharge.  I certify that inpatient services furnished can reasonably be expected to improve the patient's condition.   Mordecai Rasmussen, MD 11/15/2019, 2:38 PM

## 2019-11-15 NOTE — [facility] Group Notes (Signed)
LCSW Group Therapy Note  11/15/2019 1:00 PM  Type of Therapy/Topic:  Group Therapy:  Emotion Regulation  Participation Level:  Active   Description of Group:   The purpose of this group is to assist patients in learning to regulate negative emotions and experience positive emotions. Patients will be guided to discuss ways in which they have been vulnerable to their negative emotions. These vulnerabilities will be juxtaposed with experiences of positive emotions or situations, and patients will be challenged to use positive emotions to combat negative ones. Special emphasis will be placed on coping with negative emotions in conflict situations, and patients will process healthy conflict resolution skills.  Therapeutic Goals: 1. Patient will identify two positive emotions or experiences to reflect on in order to balance out negative emotions 2. Patient will label two or more emotions that they find the most difficult to experience 3. Patient will demonstrate positive conflict resolution skills through discussion and/or role plays  Summary of Patient Progress: Patient was present in group.  Patient was an active participant.  Patient was supportive of other group members.  Patient shared how she uses scents and hot baths as a means to deescalate when she has felt overwhelmed.    Therapeutic Modalities:   Cognitive Behavioral Therapy Feelings Identification Dialectical Behavioral Therapy  Penni Homans, MSW, LCSW 11/15/2019 2:39 PM

## 2019-11-15 NOTE — Progress Notes (Signed)
Patient alert and oriented x 4, affect is blunted thoughts are disorganized, she appears responding to internal stimuli, accusing staff of conspiracy theories, noted going to another peers room, exchanging clothes and  giving hugs, when told to stop giving hugs she got hostile, verbally agitated and was upset stated using profanities at staff members, posturing and threatening staff.  Patient eventually was less aggressive, moves away from nursing station to the day, she subsequently came in the medication took her medications and went to her room. 15 minutes safety checks maintained will continue to monitor.

## 2019-11-15 NOTE — [facility] Group Notes (Signed)
[facility] Group Notes:  (Nursing/MHT/Case Management/Adjunct)  Date:  11/15/2019  Time:  11:35 AM  Type of Therapy:  Psychoeducational Skills  Participation Level:  Active  Participation Quality:  Appropriate  Affect:  Appropriate  Cognitive:  Alert and Appropriate  Insight:  Appropriate and Good  Engagement in Group:  Engaged  Modes of Intervention:  Clarification  Summary of Progress/Problems:  Savannah [name] 11/15/2019, 11:35 AM 
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
## 2019-11-15 NOTE — Progress Notes (Signed)
Recreation Therapy Notes  INPATIENT RECREATION THERAPY ASSESSMENT  Patient Details Name: Savannah [name] MRN: [id number] DOB: 1985/08/20 Today's Date: 11/15/2019       Information Obtained From: Patient  Able to Participate in Assessment/Interview: Yes  Patient Presentation: Responsive  Reason for Admission (Per Patient): Active Symptoms, Med Non-Compliance  Patient Stressors:    Coping Skills:   Music, Counselling psychologist, Prayer  Leisure Interests (2+):  Exercise - Walking, Music - Singing(Do hair)  Frequency of Recreation/Participation: Monthly  Awareness of Community Resources:     [employer]:     Current Use:    If no, Barriers?:    Expressed Interest in [employer] Information:    [place] of Residence:  [place]  Patient Main Form of Transportation: Set designer  Patient Strengths:  helping others, Growing from my story  Patient Identified Areas of Improvement:  My temper  Patient Goal for Hospitalization:  Find my place  Current SI (including self-harm):  No  Current HI:  No  Current AVH: No  Staff Intervention Plan: Group Attendance, Collaborate with Interdisciplinary Treatment Team  Consent to Intern Participation: N/A  Savannah [name] 11/15/2019, 3:06 PM

## 2019-11-15 NOTE — Progress Notes (Signed)
Patient alert and oriented x 4, affect is blunted, thoughts are organized and coherent no distress noted , visible in the milieu interacting appropriately with peers and staff, denies SI/HI/AVH 15 minutes safety checks maintained will continue to monitor.

## 2019-11-16 NOTE — Tx Team (Addendum)
Interdisciplinary Treatment and Diagnostic Plan Update  11/16/2019 Time of Session: 9:00AM Savannah [name] MRN: [id number]  Principal Diagnosis: Schizophrenia [name])  Secondary Diagnoses: Principal Problem:   Schizophrenia (HCC)   Current Medications:  Current Facility-Administered Medications  Medication Dose Route Frequency Provider Last Rate Last Admin  . acetaminophen (TYLENOL) tablet 650 mg  650 mg Oral Q6H PRN Money, [name], FNP      . albuterol (VENTOLIN HFA) 108 (90 Base) MCG/ACT inhaler 2 puff  2 puff Inhalation Q6H PRN Clapacs, [name], MD      . alum & mag hydroxide-simeth (MAALOX/MYLANTA) 200-200-20 MG/5ML suspension 30 mL  30 mL Oral Q4H PRN Money, [name], FNP      . benztropine (COGENTIN) tablet 1 mg  1 mg Oral BID Clapacs, [name], MD   1 mg at 11/15/19 1652  . diphenhydrAMINE (BENADRYL) capsule 50 mg  50 mg Oral Q6H PRN Money, [name], FNP       Or  . diphenhydrAMINE (BENADRYL) injection 50 mg  50 mg Intramuscular Q6H PRN Money, [name], FNP      . haloperidol (HALDOL) tablet 20 mg  20 mg Oral QHS Clapacs, [name], MD   20 mg at 11/15/19 2145  . haloperidol (HALDOL) tablet 5 mg  5 mg Oral Q6H PRN Money, [name], FNP       Or  . haloperidol lactate (HALDOL) injection 5 mg  5 mg Intramuscular Q6H PRN Money, [name], FNP      . hydrOXYzine (ATARAX/VISTARIL) tablet 25 mg  25 mg Oral TID PRN Money, [name], FNP      . LORazepam (ATIVAN) tablet 2 mg  2 mg Oral Q6H PRN Money, [name], FNP   2 mg at 11/15/19 2145   Or  . LORazepam (ATIVAN) injection 2 mg  2 mg Intramuscular Q6H PRN Money, [name], FNP      . magnesium hydroxide (MILK OF MAGNESIA) suspension 30 mL  30 mL Oral Daily PRN Money, [name], FNP      . traZODone (DESYREL) tablet 50 mg  50 mg Oral QHS PRN Money, [name], FNP   50 mg at 11/15/19 2145   PTA Medications: Medications Prior to Admission  Medication Sig Dispense Refill Last Dose  . albuterol (VENTOLIN HFA) 108 (90 Base) MCG/ACT inhaler Inhale  1-2 puffs into the lungs every 6 (six) hours as needed for wheezing or shortness of breath. 18 g 0   . benzonatate (TESSALON) 200 MG capsule Take 1 capsule (200 mg total) by mouth 2 (two) times daily as needed for cough. (Patient not taking: Reported on 07/30/2019) 20 capsule 0   . benztropine (COGENTIN) 1 MG tablet Take 1 tablet (1 mg total) by mouth 2 (two) times daily. 60 tablet 2   . haloperidol (HALDOL) 20 MG tablet Take 1 tablet (20 mg total) by mouth at bedtime. 30 tablet 5   . temazepam (RESTORIL) 30 MG capsule Take 1 capsule (30 mg total) by mouth at bedtime. 30 capsule 0     Patient Stressors:    Patient Strengths:    Treatment Modalities: Medication Management, Group therapy, Case management,  1 to 1 session with clinician, Psychoeducation, Recreational therapy.   Physician Treatment Plan for Primary Diagnosis: Schizophrenia [facility]) Long Term Goal(s): Improvement in symptoms so as ready for discharge Improvement in symptoms so as ready for discharge   Short Term Goals: Ability to demonstrate self-control will improve Ability to maintain clinical measurements within normal  limits will improve Compliance with prescribed medications will improve  Medication Management: Evaluate patient's response, side effects, and tolerance of medication regimen.  Therapeutic Interventions: 1 to 1 sessions, Unit Group sessions and Medication administration.  Evaluation of Outcomes: Progressing  Physician Treatment Plan for Secondary Diagnosis: Principal Problem:   Schizophrenia ([place])  Long Term Goal(s): Improvement in symptoms so as ready for discharge Improvement in symptoms so as ready for discharge   Short Term Goals: Ability to demonstrate self-control will improve Ability to maintain clinical measurements within normal limits will improve Compliance with prescribed medications will improve     Medication Management: Evaluate patient's response, side effects, and tolerance of  medication regimen.  Therapeutic Interventions: 1 to 1 sessions, Unit Group sessions and Medication administration.  Evaluation of Outcomes: Progressing   RN Treatment Plan for Primary Diagnosis: Schizophrenia ([place]) Long Term Goal(s): Knowledge of disease and therapeutic regimen to maintain health will improve  Short Term Goals: Ability to remain free from injury will improve, Ability to verbalize frustration and anger appropriately will improve, Ability to demonstrate self-control, Ability to participate in decision making will improve, Ability to verbalize feelings will improve, Ability to disclose and discuss suicidal ideas, Ability to identify and develop effective coping behaviors will improve and Compliance with prescribed medications will improve  Medication Management: RN will administer medications as ordered by provider, will assess and evaluate patient's response and provide education to patient for prescribed medication. RN will report any adverse and/or side effects to prescribing provider.  Therapeutic Interventions: 1 on 1 counseling sessions, Psychoeducation, Medication administration, Evaluate responses to treatment, Monitor vital signs and CBGs as ordered, Perform/monitor CIWA, COWS, AIMS and Fall Risk screenings as ordered, Perform wound care treatments as ordered.  Evaluation of Outcomes: Progressing   LCSW Treatment Plan for Primary Diagnosis: Schizophrenia ([place]) Long Term Goal(s): Safe transition to appropriate next level of care at discharge, Engage patient in therapeutic group addressing interpersonal concerns.  Short Term Goals: Engage patient in aftercare planning with referrals and resources, Increase social support, Increase ability to appropriately verbalize feelings, Increase emotional regulation, Facilitate acceptance of mental health diagnosis and concerns, Identify triggers associated with mental health/substance abuse issues and Increase skills for wellness and  recovery  Therapeutic Interventions: Assess for all discharge needs, 1 to 1 time with Social worker, Explore available resources and support systems, Assess for adequacy in community support network, Educate family and significant other(s) on suicide prevention, Complete Psychosocial Assessment, Interpersonal group therapy.  Evaluation of Outcomes: Progressing   Progress in Treatment: Attending groups: Yes. Participating in groups: Yes. Taking medication as prescribed: Yes. Toleration medication: No. Family/Significant other contact made: Yes, individual(s) contacted:  Fianc Patient understands diagnosis: Yes. Discussing patient identified problems/goals with staff: Yes. Medical problems stabilized or resolved: Yes. Denies suicidal/homicidal ideation: Yes. Issues/concerns per patient self-inventory: No. Other: None  New problem(s) identified: No, Describe:  None  New Short Term/Long Term Goal(s):  Patient Goals:  "I want my medication to be regulated and I want to be discharged"  Discharge Plan or Barriers: Pt plans to be discharged to follow up aftercare treatment. CSW will continue to assess for referral for discharge.   Reason for Continuation of Hospitalization: Delusions  Medication stabilization  Estimated Length of Stay: 3-5 days  Attendees: Patient: Savannah [name] 11/16/2019   Physician: Dr. Weber Cooks, MD 11/16/2019   Nursing: Alyson Locket, RN 11/16/2019   RN Care Manager: 11/16/2019   Social Worker: [place], [place] 11/16/2019   Recreational Therapist:  11/16/2019  Other:  11/16/2019   Other:  11/16/2019   Other: 11/16/2019     Scribe for Treatment Team: Jimmey Ralph, Theresia Majors 11/16/2019 9:48 AM

## 2019-11-16 NOTE — Progress Notes (Signed)
Patient was in her room upon arrival to the unit. Patient was pleasant during assessment denying SI/HI/AVH, pain, anxiety and depression with this Clinical research associate. Patient does present with disorganized thoughts. Patient compliant with medication administration per MD orders. Patient observed interacting appropriately with peers and staff on the unit. Pt being monitored Q 15 minutes for safety per unit protocol. Pt remains safe on the unit.

## 2019-11-16 NOTE — Progress Notes (Signed)
[facility] MD Progress Note  11/16/2019 1:52 PM Savannah [name]  MRN:  [id number] Subjective: Patient seen chart reviewed.  Patient met with treatment team.  34 year old woman with a history of chronic mental health problems.  Patient continues to be very delusional.  During our conversation she mentioned that she owns the entire state of [place] and is going to take all of our jobs away from [place].  She is disorganized in her thinking and has poor insight.  She has not been aggressive or threatening although at times she is a little hyperactive in the milieu.  She has no specific physical complaints. Principal Problem: Schizophrenia ([place]) Diagnosis: Principal Problem:   Schizophrenia ([place])  Total Time spent with patient: 30 minutes  Past Psychiatric History: Patient has a history of schizophrenia with a recent hospitalization in [place] but has recently been noncompliant with medicine  Past Medical History:  Past Medical History:  Diagnosis Date  . Depression   . Kidney infection    No past surgical history on file. Family History:  Family History  Problem Relation Age of Onset  . Diabetes Other   . Hypertension Other   . Stroke Other    Family Psychiatric  History: See previous notes Social History:  Social History   Substance and Sexual Activity  Alcohol Use Yes   Comment: Occasional Drinker     Social History   Substance and Sexual Activity  Drug Use Yes  . Types: Marijuana    Social History   Socioeconomic History  . Marital status: Significant Other    Spouse name: Not on file  . Number of children: Not on file  . Years of education: Not on file  . Highest education level: Not on file  Occupational History  . Not on file  Tobacco Use  . Smoking status: Current Every Day Smoker    Packs/day: 0.30    Types: Cigarettes  . Smokeless tobacco: Never Used  Substance and Sexual Activity  . Alcohol use: Yes    Comment: Occasional Drinker  . Drug use: Yes   Types: Marijuana  . Sexual activity: Yes    Birth control/protection: None  Other Topics Concern  . Not on file  Social History Narrative   Pt states she lives alone in an apartment but has a fiance, [name], who brought her in to [facility] due to [place].   Social Determinants of Health   Financial Resource Strain:   . Difficulty of Paying Living Expenses:   Food Insecurity:   . Worried About Charity fundraiser in the Last Year:   . Arboriculturist in the Last Year:   Transportation Needs:   . Film/video editor (Medical):   Marland Kitchen Lack of Transportation (Non-Medical):   Physical Activity:   . Days of Exercise per Week:   . Minutes of Exercise per Session:   Stress:   . Feeling of Stress :   Social Connections:   . Frequency of Communication with Friends and Family:   . Frequency of Social Gatherings with Friends and Family:   . Attends Religious Services:   . Active Member of Clubs or Organizations:   . Attends Archivist Meetings:   Marland Kitchen Marital Status:    Additional Social History:                         Sleep: Fair  Appetite:  Fair  Current Medications: Current Facility-Administered Medications  Medication Dose Route Frequency Provider Last Rate Last Admin  . acetaminophen (TYLENOL) tablet 650 mg  650 mg Oral Q6H PRN Money, Lowry Ram, FNP      . albuterol (VENTOLIN HFA) 108 (90 Base) MCG/ACT inhaler 2 puff  2 puff Inhalation Q6H PRN Nakeita Styles T, MD      . alum & mag hydroxide-simeth (MAALOX/MYLANTA) 200-200-20 MG/5ML suspension 30 mL  30 mL Oral Q4H PRN Money, Darnelle Maffucci B, FNP      . benztropine (COGENTIN) tablet 1 mg  1 mg Oral BID Magdalen Cabana, Madie Reno, MD   1 mg at 11/16/19 0959  . diphenhydrAMINE (BENADRYL) capsule 50 mg  50 mg Oral Q6H PRN Money, Lowry Ram, FNP       Or  . diphenhydrAMINE (BENADRYL) injection 50 mg  50 mg Intramuscular Q6H PRN Money, Darnelle Maffucci B, FNP      . haloperidol (HALDOL) tablet 20 mg  20 mg Oral QHS Jestina Stephani T, MD   20 mg at  11/15/19 2145  . haloperidol (HALDOL) tablet 5 mg  5 mg Oral Q6H PRN Money, Lowry Ram, FNP   5 mg at 11/16/19 7017   Or  . haloperidol lactate (HALDOL) injection 5 mg  5 mg Intramuscular Q6H PRN Money, Lowry Ram, FNP      . hydrOXYzine (ATARAX/VISTARIL) tablet 25 mg  25 mg Oral TID PRN Money, Lowry Ram, FNP      . LORazepam (ATIVAN) tablet 2 mg  2 mg Oral Q6H PRN Money, Lowry Ram, FNP   2 mg at 11/16/19 0959   Or  . LORazepam (ATIVAN) injection 2 mg  2 mg Intramuscular Q6H PRN Money, Darnelle Maffucci B, FNP      . magnesium hydroxide (MILK OF MAGNESIA) suspension 30 mL  30 mL Oral Daily PRN Money, Lowry Ram, FNP      . traZODone (DESYREL) tablet 50 mg  50 mg Oral QHS PRN Money, Lowry Ram, FNP   50 mg at 11/15/19 2145    Lab Results: No results found for this or any previous visit (from the past 48 hour(s)).  Blood Alcohol level:  Lab Results  Component Value Date   ETH <10 11/11/2019   ETH <10 79/39/0300    Metabolic Disorder Labs: Lab Results  Component Value Date   HGBA1C 5.5 10/01/2019   MPG 111.15 10/01/2019   MPG 105.41 08/01/2019   Lab Results  Component Value Date   PROLACTIN 34.9 (H) 08/01/2019   Lab Results  Component Value Date   CHOL 132 10/01/2019   TRIG 140 10/01/2019   HDL 30 (L) 10/01/2019   CHOLHDL 4.4 10/01/2019   VLDL 28 10/01/2019   LDLCALC 74 10/01/2019   LDLCALC 75 08/01/2019    Physical Findings: AIMS:  , ,  ,  ,    CIWA:    COWS:     Musculoskeletal: Strength & Muscle Tone: within normal limits Gait & Station: normal Patient leans: N/A  Psychiatric Specialty Exam: Physical Exam  Nursing note and vitals reviewed. Constitutional: She appears well-developed and well-nourished.  HENT:  Head: Normocephalic and atraumatic.  Eyes: Pupils are equal, round, and reactive to light. Conjunctivae are normal.  Cardiovascular: Regular rhythm and normal heart sounds.  Respiratory: Effort normal. No respiratory distress.  GI: Soft.  Musculoskeletal:         General: Normal range of motion.     Cervical back: Normal range of motion.  Neurological: She is alert.  Skin: Skin is warm and dry.  Psychiatric:  Her affect is labile and inappropriate. Her speech is tangential. She is agitated. She is not aggressive. Thought content is paranoid and delusional. Cognition and memory are impaired. She expresses inappropriate judgment.    Review of Systems  Constitutional: Negative.   HENT: Negative.   Eyes: Negative.   Respiratory: Negative.   Cardiovascular: Negative.   Gastrointestinal: Negative.   Musculoskeletal: Negative.   Skin: Negative.   Neurological: Negative.   Psychiatric/Behavioral: Positive for confusion.    Blood pressure 102/68, pulse 81, temperature 97.7 F (36.5 C), temperature source Oral, resp. rate 18, height 5' 2"  (1.575 m), weight 104.3 kg, SpO2 100 %.Body mass index is 42.07 kg/m.  General Appearance: Casual  Eye Contact:  Fair  Speech:  Slow  Volume:  Decreased  Mood:  Irritable  Affect:  Inappropriate  Thought Process:  Disorganized  Orientation:  Negative  Thought Content:  Illogical, Delusions, Paranoid Ideation and Rumination  Suicidal Thoughts:  No  Homicidal Thoughts:  No  Memory:  Immediate;   Fair Recent;   Poor Remote;   Poor  Judgement:  Impaired  Insight:  Lacking  Psychomotor Activity:  Restlessness  Concentration:  Concentration: Fair  Recall:  Poor  Fund of Knowledge:  Fair  Language:  Fair  Akathisia:  No  Handed:  Right  AIMS (if indicated):     Assets:  Housing Physical Health Resilience  ADL's:  Impaired  Cognition:  Impaired,  Mild  Sleep:  Number of Hours: 6.5     Treatment Plan Summary: Daily contact with patient to assess and evaluate symptoms and progress in treatment, Medication management and Plan 34 year old woman with schizophrenia.  Continues to be disorganized delusional paranoid bizarre in her thinking with poor insight.  Has recently had dangerous behavior outside the  hospital.  We are trying to work towards getting her on a long-acting injectable medication.  Patient is asking me to call her boyfriend this afternoon and I will make an attempt to get in touch before the end of the day.  For now no change to medicine  Alethia Berthold, MD 11/16/2019, 1:52 PM

## 2019-11-16 NOTE — Plan of Care (Signed)
Patient stated she was feeling better and didn't have any outbursts on the unit this evening.   Problem: Education: Goal: Emotional status will improve Outcome: Progressing Goal: Mental status will improve Outcome: Progressing

## 2019-11-16 NOTE — [facility] Group Notes (Signed)
LCSW Group Therapy Notes  Date and Time: 11/16/19 1:00PM  Type of Therapy and Topic: Group Therapy: Healthy Vs. Unhealthy Coping Strategies  Participation Level: [facility] PARTICIPATION LEVEL: Did Not Attend  Description of Group:  In this group, patients will be encouraged to explore their healthy and unhealthy coping strategics. Coping strategies are actions that we take to deal with stress, problems, or uncomfortable emotions in our daily lives. Each patient will be challenged to read some scenarios and discuss the unhealthy and healthy coping strategies within those scenarios. Also, each patient will be challenged to describe current healthy and unhealthy strategies that they use in their own lives and discuss the outcomes and barriers to those strategies. This group will be process-oriented, with patients participating in exploration of their own experiences as well as giving and receiving support and challenge from other group members.  Therapeutic Goals: 1. Patient will identify personal healthy and unhealthy coping strategies. 2. Patient will identify healthy and unhealthy coping strategies, in others, through scenarios.  3. Patient will identify expected outcomes of healthy and unhealthy coping strategies. 4. Patient will identify barriers to using healthy coping strategies.   Summary of Patient Progress:  X  Therapeutic Modalities:  Cognitive Behavioral Therapy Solution Focused Therapy Motivational Interviewing   Teresita Madura, MSW, [employer] Clinical Social Worker

## 2019-11-17 ENCOUNTER — Encounter: Payer: Self-pay | Admitting: Psychiatry

## 2019-11-17 MED ORDER — BENZTROPINE MESYLATE 1 MG PO TABS: 1.0000 mg | ORAL_TABLET | Freq: Two times a day (BID) | ORAL | 0 refills | Status: DC

## 2019-11-17 MED ORDER — HALOPERIDOL 10 MG PO TABS: 10.0000 mg | ORAL_TABLET | Freq: Every day | ORAL | 0 refills | Status: DC

## 2019-11-17 MED ORDER — ALBUTEROL SULFATE HFA 108 (90 BASE) MCG/ACT IN AERS: 2.0000 | INHALATION_SPRAY | Freq: Four times a day (QID) | RESPIRATORY_TRACT | 0 refills | Status: DC | PRN

## 2019-11-17 MED ORDER — TRAZODONE HCL 50 MG PO TABS: 50.0000 mg | ORAL_TABLET | Freq: Every evening | ORAL | 0 refills | Status: DC | PRN

## 2019-11-17 MED ORDER — HALOPERIDOL 5 MG PO TABS
10.0000 mg | ORAL_TABLET | Freq: Every day | ORAL | Status: DC
  Administered 2019-11-17: 10 mg via ORAL
  Filled 2019-11-17: qty 2

## 2019-11-17 MED ORDER — HALOPERIDOL DECANOATE 100 MG/ML IM SOLN
200.0000 mg | Freq: Once | INTRAMUSCULAR | Status: AC
  Administered 2019-11-17: 200 mg via INTRAMUSCULAR
  Filled 2019-11-17: qty 2

## 2019-11-17 NOTE — Plan of Care (Signed)
D- Patient alert and oriented. Patient presents in a pleasant mood on assessment stating that she slept alright last night and had no complaints to voice to this writer at this time. Patient denies SI, HI, AVH, and pain at this time. Patient also denies any signs/symptoms of depression/anxiety, reporting that she is feeling "very good". Patient's goal for today is "going home with the medicine at a lower rate and enjoying my children", in which she will "keep pushing forward and stay positive", in order to achieve her goal.  A- Scheduled medications administered to patient, per MD orders. Support and encouragement provided.  Routine safety checks conducted every 15 minutes.  Patient informed to notify staff with problems or concerns.  R- No adverse drug reactions noted. Patient contracts for safety at this time. Patient compliant with medications and treatment plan. Patient receptive, calm, and cooperative. Patient interacts well with others on the unit.  Patient remains safe at this time.  Problem: Education: Goal: Knowledge of [place] [facility] Education information/materials will improve Outcome: Progressing Goal: Emotional status will improve Outcome: Progressing Goal: Mental status will improve Outcome: Progressing Goal: Verbalization of understanding the information provided will improve Outcome: Progressing   Problem: Activity: Goal: Will verbalize the importance of balancing activity with adequate rest periods Outcome: Progressing   Problem: Education: Goal: Will be free of psychotic symptoms Outcome: Progressing Goal: Knowledge of the prescribed therapeutic regimen will improve Outcome: Progressing   Problem: Coping: Goal: Coping ability will improve Outcome: Progressing Goal: Will verbalize feelings Outcome: Progressing   Problem: Health Behavior/Discharge Planning: Goal: Compliance with prescribed medication regimen will improve Outcome: Progressing   Problem:  Nutritional: Goal: Ability to achieve adequate nutritional intake will improve Outcome: Progressing   Problem: Role Relationship: Goal: Ability to communicate needs accurately will improve Outcome: Progressing Goal: Ability to interact with others will improve Outcome: Progressing   Problem: Safety: Goal: Ability to redirect hostility and anger into socially appropriate behaviors will improve Outcome: Progressing Goal: Ability to remain free from injury will improve Outcome: Progressing   Problem: Self-Care: Goal: Ability to participate in self-care as condition permits will improve Outcome: Progressing   Problem: Self-Concept: Goal: Will verbalize positive feelings about self Outcome: Progressing

## 2019-11-17 NOTE — [facility] Group Notes (Signed)
LCSW Group Therapy Note  11/17/2019 1:00pm  Type of Therapy and Topic:  Group Therapy:  Cognitive Distortions  Participation Level:  None   Description of Group:    Patients in this group will be introduced to the topic of cognitive distortions.  Patients will identify and describe cognitive distortions, describe the feelings these distortions create for them.  Patients will identify one or more situations in their personal life where they have cognitively distorted thinking and will verbalize challenging this cognitive distortion through positive thinking skills.  Patients will practice the skill of using positive affirmations to challenge cognitive distortions using affirmation cards.    Therapeutic Goals:  1. Patient will identify two or more cognitive distortions they have used 2. Patient will identify one or more emotions that stem from use of a cognitive distortion 3. Patient will demonstrate use of a positive affirmation to counter a cognitive distortion through discussion and/or role play. 4. Patient will describe one way cognitive distortions can be detrimental to wellness   Summary of Patient Progress:  The pt attended the group but slept the entire duration. Then proceeded to leave early.    Therapeutic Modalities:   Cognitive Behavioral Therapy Motivational Interviewing   Teresita Madura, MSW, [employer] Clinical Social Worker  11/17/2019 2:10 PM

## 2019-11-17 NOTE — Progress Notes (Signed)
[facility] MD Progress Note  11/17/2019 11:00 AM MATTHEW PAIS  MRN:  [id number] Subjective: Patient seen and chart reviewed.  Patient was calm her and more lucid today.  Did not make any frankly bizarre comments during our conversation.  She is taking care of her hygiene and looks neat and well put together.  She is having conversations with her family and partner regularly on the phone and is not showing any temper or behavior issues.  She has been taking the Haldol 20 mg at night and is not showing any signs of stiffness. Principal Problem: Schizophrenia ([place]) Diagnosis: Principal Problem:   Schizophrenia ([place])  Total Time spent with patient: 30 minutes  Past Psychiatric History: Patient has a history of schizophrenia complicated by noncompliance  Past Medical History:  Past Medical History:  Diagnosis Date  . Depression   . Kidney infection    History reviewed. No pertinent surgical history. Family History:  Family History  Problem Relation Age of Onset  . Diabetes Other   . Hypertension Other   . Stroke Other    Family Psychiatric  History: See previous Social History:  Social History   Substance and Sexual Activity  Alcohol Use Yes   Comment: Occasional Drinker     Social History   Substance and Sexual Activity  Drug Use Yes  . Types: Marijuana    Social History   Socioeconomic History  . Marital status: Significant Other    Spouse name: Not on file  . Number of children: Not on file  . Years of education: Not on file  . Highest education level: Not on file  Occupational History  . Not on file  Tobacco Use  . Smoking status: Current Every Day Smoker    Packs/day: 0.30    Types: Cigarettes  . Smokeless tobacco: Never Used  Substance and Sexual Activity  . Alcohol use: Yes    Comment: Occasional Drinker  . Drug use: Yes    Types: Marijuana  . Sexual activity: Yes    Birth control/protection: None  Other Topics Concern  . Not on file  Social History  Narrative   Pt states she lives alone in an apartment but has a fiance, [name], who brought her in to [facility] due to [place].   Social Determinants of Health   Financial Resource Strain:   . Difficulty of Paying Living Expenses:   Food Insecurity:   . Worried About Charity fundraiser in the Last Year:   . Arboriculturist in the Last Year:   Transportation Needs:   . Film/video editor (Medical):   Marland Kitchen Lack of Transportation (Non-Medical):   Physical Activity:   . Days of Exercise per Week:   . Minutes of Exercise per Session:   Stress:   . Feeling of Stress :   Social Connections:   . Frequency of Communication with Friends and Family:   . Frequency of Social Gatherings with Friends and Family:   . Attends Religious Services:   . Active Member of Clubs or Organizations:   . Attends Archivist Meetings:   Marland Kitchen Marital Status:    Additional Social History:                         Sleep: Fair  Appetite:  Fair  Current Medications: Current Facility-Administered Medications  Medication Dose Route Frequency Provider Last Rate Last Admin  . acetaminophen (TYLENOL) tablet 650 mg  650 mg  Oral Q6H PRN Money, [name], FNP      . albuterol (VENTOLIN HFA) 108 (90 Base) MCG/ACT inhaler 2 puff  2 puff Inhalation Q6H PRN Kathalene Sporer T, MD      . alum & mag hydroxide-simeth (MAALOX/MYLANTA) 200-200-20 MG/5ML suspension 30 mL  30 mL Oral Q4H PRN Money, [name], FNP      . benztropine (COGENTIN) tablet 1 mg  1 mg Oral BID Geovanie Winnett, [name], MD   1 mg at 11/17/19 2426  . diphenhydrAMINE (BENADRYL) capsule 50 mg  50 mg Oral Q6H PRN Money, [name], FNP       Or  . diphenhydrAMINE (BENADRYL) injection 50 mg  50 mg Intramuscular Q6H PRN Money, [name], FNP      . haloperidol (HALDOL) tablet 10 mg  10 mg Oral QHS Jonh Mcqueary T, MD      . haloperidol (HALDOL) tablet 5 mg  5 mg Oral Q6H PRN Money, [name], FNP   5 mg at 11/16/19 8341   Or  . haloperidol lactate (HALDOL)  injection 5 mg  5 mg Intramuscular Q6H PRN Money, [name], FNP      . haloperidol decanoate (HALDOL DECANOATE) 100 MG/ML injection 200 mg  200 mg Intramuscular Once Aira Sallade T, MD      . hydrOXYzine (ATARAX/VISTARIL) tablet 25 mg  25 mg Oral TID PRN Money, [name], FNP      . LORazepam (ATIVAN) tablet 2 mg  2 mg Oral Q6H PRN Money, [name], FNP   2 mg at 11/16/19 0959   Or  . LORazepam (ATIVAN) injection 2 mg  2 mg Intramuscular Q6H PRN Money, [name], FNP      . magnesium hydroxide (MILK OF MAGNESIA) suspension 30 mL  30 mL Oral Daily PRN Money, [name], FNP      . traZODone (DESYREL) tablet 50 mg  50 mg Oral QHS PRN Money, [name], FNP   50 mg at 11/16/19 2139    Lab Results: No results found for this or any previous visit (from the past 48 hour(s)).  Blood Alcohol level:  Lab Results  Component Value Date   ETH <10 11/11/2019   ETH <10 10/01/2019    Metabolic Disorder Labs: Lab Results  Component Value Date   HGBA1C 5.5 10/01/2019   MPG 111.15 10/01/2019   MPG 105.41 08/01/2019   Lab Results  Component Value Date   PROLACTIN 34.9 (H) 08/01/2019   Lab Results  Component Value Date   CHOL 132 10/01/2019   TRIG 140 10/01/2019   HDL 30 (L) 10/01/2019   CHOLHDL 4.4 10/01/2019   VLDL 28 10/01/2019   LDLCALC 74 10/01/2019   LDLCALC 75 08/01/2019    Physical Findings: AIMS:  , ,  ,  ,    CIWA:    COWS:     Musculoskeletal: Strength & Muscle Tone: within normal limits Gait & Station: normal Patient leans: N/A  Psychiatric Specialty Exam: Physical Exam  Nursing note and vitals reviewed. Constitutional: She appears well-developed and well-nourished.  HENT:  Head: Normocephalic and atraumatic.  Eyes: Pupils are equal, round, and reactive to light. Conjunctivae are normal.  Cardiovascular: Regular rhythm and normal heart sounds.  Respiratory: Effort normal. No respiratory distress.  GI: Soft.  Musculoskeletal:        General: Normal range of motion.      Cervical back: Normal range of motion.  Neurological: She is alert.  Skin: Skin is warm and dry.  Psychiatric: She has a normal mood and affect. Judgment normal. Her speech is tangential. She is not agitated and not aggressive. Thought content is not paranoid. Cognition and memory are impaired. She expresses no homicidal and no suicidal ideation.    Review of Systems  Constitutional: Negative.   HENT: Negative.   Eyes: Negative.   Respiratory: Negative.   Cardiovascular: Negative.   Gastrointestinal: Negative.   Musculoskeletal: Negative.   Skin: Negative.   Neurological: Negative.   Psychiatric/Behavioral: The patient is nervous/anxious.     Blood pressure 135/90, pulse 80, temperature 97.8 F (36.6 C), temperature source Oral, resp. rate 17, height 5\' 2"  (1.575 m), weight 104.3 kg, SpO2 100 %.Body mass index is 42.07 kg/m.  General Appearance: Casual  Eye Contact:  Good  Speech:  Clear and Coherent  Volume:  Normal  Mood:  Euthymic  Affect:  Congruent  Thought Process:  Goal Directed  Orientation:  Full (Time, Place, and Person)  Thought Content:  Logical  Suicidal Thoughts:  No  Homicidal Thoughts:  No  Memory:  Immediate;   Fair Recent;   Fair Remote;   Fair  Judgement:  Fair  Insight:  Fair  Psychomotor Activity:  Normal  Concentration:  Concentration: Fair  Recall:  of Knowledge:  Fair  Language:  Fair  Akathisia:  No  Handed:  Right  AIMS (if indicated):     Assets:  Desire for Improvement Housing Physical Health  ADL's:  Intact  Cognition:  WNL  Sleep:  Number of Hours: 7     Treatment Plan Summary: Daily contact with patient to assess and evaluate symptoms and progress in treatment, Medication management and Plan I talked with her today about the advantages of haloperidol decanoate and that it can ensure compliance and improve the chances of a person staying out of the hospital.  She was agreeable to this and so I have ordered a Haldol  decanoate 200 mg shot today and then decrease the dose of the oral Haldol starting tonight.  If this all goes well and there are no new problems we may be able to look at discharge within the next day or so.  [employer], MD 11/17/2019, 11:00 AM

## 2019-11-17 NOTE — [facility] Group Notes (Signed)
[facility] Group Notes:  (Nursing/MHT/Case Management/Adjunct)  Date:  11/17/2019  Time:  5:35 AM  Type of Therapy:  Group Therapy  Participation Level:  Active  Participation Quality:  Appropriate  Affect:  Appropriate  Cognitive:  Appropriate  Insight:  Appropriate  Engagement in Group:  Engaged  Modes of Intervention:  Discussion and Support  Summary of Progress/Problems: PT stated the wants to learn herself and conduct herself as a lady and be there and support her kids. She wants to be a non violent person  Landry Mellow 11/17/2019, 5:35 AM

## 2019-11-17 NOTE — [facility] Suicide Risk Assessment (Signed)
[facility] Discharge Suicide Risk Assessment   Principal Problem: Schizophrenia [facility]) Discharge Diagnoses: Principal Problem:   Schizophrenia (HCC)   Total Time spent with patient: 30 minutes  Musculoskeletal: Strength & Muscle Tone: within normal limits Gait & Station: normal Patient leans: N/A  Psychiatric Specialty Exam: Review of Systems  Constitutional: Negative.   HENT: Negative.   Eyes: Negative.   Respiratory: Negative.   Cardiovascular: Negative.   Gastrointestinal: Negative.   Musculoskeletal: Negative.   Skin: Negative.   Neurological: Negative.   Psychiatric/Behavioral: Negative.     Blood pressure 135/90, pulse 80, temperature 97.8 F (36.6 C), temperature source Oral, resp. rate 17, height 5\' 2"  (1.575 m), weight 104.3 kg, SpO2 100 %.Body mass index is 42.07 kg/m.  General Appearance: Casual  Eye Contact::  Good  Speech:  Clear and Coherent409  Volume:  Normal  Mood:  Euthymic  Affect:  Constricted  Thought Process:  Coherent  Orientation:  Full (Time, Place, and Person)  Thought Content:  Logical  Suicidal Thoughts:  No  Homicidal Thoughts:  No  Memory:  Immediate;   Fair Recent;   Fair Remote;   Fair  Judgement:  Fair  Insight:  Fair  Psychomotor Activity:  Normal  Concentration:  Fair  Recall:  [id number] of Knowledge:Fair  Language: Fair  Akathisia:  No  Handed:  Right  AIMS (if indicated):     Assets:  Desire for Improvement Housing Physical Health Resilience  Sleep:  Number of Hours: 7  Cognition: WNL  ADL's:  Intact   Mental Status Per Nursing Assessment::   On Admission:  NA  Demographic Factors:  NA  Loss Factors: Financial problems/change in socioeconomic status  Historical Factors: Impulsivity  Risk Reduction Factors:   Sense of responsibility to family, Employed, Living with another person, especially a relative, Positive social support and Positive therapeutic relationship  Continued Clinical Symptoms:  Schizophrenia:    Paranoid or undifferentiated type  Cognitive Features That Contribute To Risk:  None    Suicide Risk:  Minimal: No identifiable suicidal ideation.  Patients presenting with no risk factors but with morbid ruminations; may be classified as minimal risk based on the severity of the depressive symptoms  Follow-up Information    [facility] Follow up.   Why: Appointment is 12/05/2019 at 12pm. Contact information: [address] [place] [phone]           Plan Of Care/Follow-up recommendations:  Activity:  Activity as tolerated Diet:  Regular diet Other:  Strongly encouraged continued follow-up with outpatient provider in [place].  [place], MD 11/17/2019, 11:06 AM

## 2019-11-18 MED ORDER — TRAZODONE HCL 50 MG PO TABS: 50.0000 mg | ORAL_TABLET | Freq: Every evening | ORAL | 1 refills | Status: AC | PRN

## 2019-11-18 MED ORDER — ALBUTEROL SULFATE HFA 108 (90 BASE) MCG/ACT IN AERS: 2.0000 | INHALATION_SPRAY | Freq: Four times a day (QID) | RESPIRATORY_TRACT | 1 refills | Status: AC | PRN

## 2019-11-18 MED ORDER — HALOPERIDOL 10 MG PO TABS: 10.0000 mg | ORAL_TABLET | Freq: Every day | ORAL | 1 refills | Status: AC

## 2019-11-18 MED ORDER — BENZTROPINE MESYLATE 1 MG PO TABS: 1.0000 mg | ORAL_TABLET | Freq: Two times a day (BID) | ORAL | 1 refills | Status: AC

## 2019-11-18 NOTE — Plan of Care (Signed)
  Problem: Group Participation Goal: STG - Patient will focus on task/topic with 2 prompts from staff within 5 recreation therapy group sessions Description: STG - Patient will focus on task/topic with 2 prompts from staff within 5 recreation therapy group sessions Outcome: Completed/Met

## 2019-11-18 NOTE — Progress Notes (Signed)
D- Patient alert and oriented. Patient presents in a pleasant mood on assessment stating that she slept ok last night and had no complaints to voice to this Clinical research associate. Patient denies any signs/symptoms of depression/anxiety. Patient also denies SI, HI, AVH, and pain at this time. Patient has no stated goals for today.  A- Scheduled medications administered to patient, per MD orders. Support and encouragement provided.  Routine safety checks conducted every 15 minutes.  Patient informed to notify staff with problems or concerns.  R- No adverse drug reactions noted. Patient contracts for safety at this time. Patient compliant with medications and treatment plan. Patient receptive, calm, and cooperative. Patient interacts well with others on the unit.  Patient remains safe at this time.

## 2019-11-18 NOTE — Discharge Summary (Signed)
Physician Discharge Summary Note  Patient:  Savannah [name] is an 34 y.o., female MRN:  [id number] DOB:  June 28, 1986 Patient phone:  [phone] (home)  Patient address:   [address] [place] [address],  Total Time spent with patient: 30 minutes  Date of Admission:  11/14/2019 Date of Discharge: November 18, 2019  Reason for Admission: Patient was admitted because of worsening of psychosis with confused bizarre behavior at home probable medication noncompliance.  Psychosis causing her to get in trouble with the law.  Principal Problem: Schizophrenia [facility]) Discharge Diagnoses: Principal Problem:   Schizophrenia [facility])   Past Psychiatric History: Patient has a history of schizophrenia with limited outpatient follow-up or compliance.  Recently has had worsening paranoid and bizarre behavior  Past Medical History:  Past Medical History:  Diagnosis Date  . Depression   . Kidney infection    History reviewed. No pertinent surgical history. Family History:  Family History  Problem Relation Age of Onset  . Diabetes Other   . Hypertension Other   . Stroke Other    Family Psychiatric  History: See previous Social History:  Social History   Substance and Sexual Activity  Alcohol Use Yes   Comment: Occasional Drinker     Social History   Substance and Sexual Activity  Drug Use Yes  . Types: Marijuana    Social History   Socioeconomic History  . Marital status: Significant Other    Spouse name: Not on file  . Number of children: Not on file  . Years of education: Not on file  . Highest education level: Not on file  Occupational History  . Not on file  Tobacco Use  . Smoking status: Current Every Day Smoker    Packs/day: 0.30    Types: Cigarettes  . Smokeless tobacco: Never Used  Substance and Sexual Activity  . Alcohol use: Yes    Comment: Occasional Drinker  . Drug use: Yes    Types: Marijuana  . Sexual activity: Yes    Birth control/protection: None  Other  Topics Concern  . Not on file  Social History Narrative   Pt states she lives alone in an apartment but has a fiance, Don Broach, who brought her in to [facility] due to AMS.   Social Determinants of Health   Financial Resource Strain:   . Difficulty of Paying Living Expenses:   Food Insecurity:   . Worried About Programme researcher, broadcasting/film/video in the Last Year:   . Barista in the Last Year:   Transportation Needs:   . Freight forwarder (Medical):   Marland Kitchen Lack of Transportation (Non-Medical):   Physical Activity:   . Days of Exercise per Week:   . Minutes of Exercise per Session:   Stress:   . Feeling of Stress :   Social Connections:   . Frequency of Communication with Friends and Family:   . Frequency of Social Gatherings with Friends and Family:   . Attends Religious Services:   . Active Member of Clubs or Organizations:   . Attends Banker Meetings:   Marland Kitchen Marital Status:     Hospital Course: Patient admitted to psychiatric unit.  She initially presented with delusional disorganized paranoid thoughts and behavior.  Argumentative but not physically aggressive.  Hygiene was good and she did a good job taking care of herself.  Patient was started back on 20 mg of haloperidol at night which had been her dose at her last discharge.  She tolerated this and rather quickly seemed to show improvement.  She was agreeable to my suggestion that we start her on haloperidol decanoate.  She got 200 mg of haloperidol decanoate and was discharged on half-size dose of the oral at 10 mg at night.  No dangerous behavior no aggression no violence or threats during her hospital stay.  She will follow-up with outpatient treatment back in [place]  Physical Findings: AIMS:  , ,  ,  ,    CIWA:    COWS:     Musculoskeletal: Strength & Muscle Tone: within normal limits Gait & Station: normal Patient leans: N/A  Psychiatric Specialty Exam: Physical Exam  Nursing note and vitals  reviewed. Constitutional: She appears well-developed and well-nourished.  HENT:  Head: Normocephalic and atraumatic.  Eyes: Pupils are equal, round, and reactive to light. Conjunctivae are normal.  Cardiovascular: Regular rhythm and normal heart sounds.  Respiratory: Effort normal. No respiratory distress.  GI: Soft.  Musculoskeletal:        General: Normal range of motion.     Cervical back: Normal range of motion.  Neurological: She is alert.  Skin: Skin is warm and dry.  Psychiatric: Judgment normal. Her affect is blunt. Her speech is delayed. She is slowed. Thought content is not paranoid. Cognition and memory are normal. She expresses no homicidal and no suicidal ideation.    Review of Systems  Constitutional: Negative.   HENT: Negative.   Eyes: Negative.   Respiratory: Negative.   Cardiovascular: Negative.   Gastrointestinal: Negative.   Musculoskeletal: Negative.   Skin: Negative.   Neurological: Negative.   Psychiatric/Behavioral: Negative.     Blood pressure 117/76, pulse 80, temperature 98 F (36.7 C), temperature source Oral, resp. rate 18, height 5\' 2"  (1.575 m), weight 104.3 kg, SpO2 100 %.Body mass index is 42.07 kg/m.  General Appearance: Casual  Eye Contact:  Good  Speech:  Clear and Coherent  Volume:  Normal  Mood:  Euthymic  Affect:  Congruent  Thought Process:  Goal Directed  Orientation:  Full (Time, Place, and Person)  Thought Content:  Logical  Suicidal Thoughts:  No  Homicidal Thoughts:  No  Memory:  Immediate;   Fair Recent;   Fair Remote;   Fair  Judgement:  Fair  Insight:  Fair  Psychomotor Activity:  Normal  Concentration:  Concentration: Fair  Recall:  [place] of Knowledge:  Fair  Language:  Fair  Akathisia:  No  Handed:  Right  AIMS (if indicated):     Assets:  Desire for Improvement Housing Physical Health Resilience  ADL's:  Intact  Cognition:  WNL  Sleep:  Number of Hours: 7     Have you used any form of tobacco in the  last 30 days? (Cigarettes, Smokeless Tobacco, Cigars, and/or Pipes): Yes  Has this patient used any form of tobacco in the last 30 days? (Cigarettes, Smokeless Tobacco, Cigars, and/or Pipes) Yes, No  Blood Alcohol level:  Lab Results  Component Value Date   ETH <10 11/11/2019   ETH <10 93/23/5573    Metabolic Disorder Labs:  Lab Results  Component Value Date   HGBA1C 5.5 10/01/2019   MPG 111.15 10/01/2019   MPG 105.41 08/01/2019   Lab Results  Component Value Date   PROLACTIN 34.9 (H) 08/01/2019   Lab Results  Component Value Date   CHOL 132 10/01/2019   TRIG 140 10/01/2019   HDL 30 (L) 10/01/2019   CHOLHDL 4.4 10/01/2019   VLDL 28  10/01/2019   LDLCALC 74 10/01/2019   LDLCALC 75 08/01/2019    See Psychiatric Specialty Exam and Suicide Risk Assessment completed by Attending Physician prior to discharge.  Discharge destination:  Home  Is patient on multiple antipsychotic therapies at discharge:  No   Has Patient had three or more failed trials of antipsychotic monotherapy by history:  No  Recommended Plan for Multiple Antipsychotic Therapies: NA  Discharge Instructions    Diet - low sodium heart healthy   Complete by: As directed    Increase activity slowly   Complete by: As directed      Allergies as of 11/18/2019      Reactions   Sudafed [pseudoephedrine] Swelling   Swelling      Medication List    STOP taking these medications   benzonatate 200 MG capsule Commonly known as: TESSALON   temazepam 30 MG capsule Commonly known as: RESTORIL     TAKE these medications     Indication  albuterol 108 (90 Base) MCG/ACT inhaler Commonly known as: VENTOLIN HFA Inhale 2 puffs into the lungs every 6 (six) hours as needed for wheezing or shortness of breath. What changed: how much to take  Indication: Asthma   benztropine 1 MG tablet Commonly known as: COGENTIN Take 1 tablet (1 mg total) by mouth 2 (two) times daily.  Indication: Extrapyramidal Reaction  caused by Medications   haloperidol 10 MG tablet Commonly known as: HALDOL Take 1 tablet (10 mg total) by mouth at bedtime. What changed:   medication strength  how much to take  Indication: Psychosis   traZODone 50 MG tablet Commonly known as: DESYREL Take 1 tablet (50 mg total) by mouth at bedtime as needed for sleep.  Indication: Trouble Sleeping      Follow-up Information    [facility] Follow up.   Why: Appointment is 12/05/2019 at 12pm. Contact information: [address] [place] [address] [phone]           Follow-up recommendations:  Activity:  Activity as tolerated Diet:  Regular diet Other:  Follow-up with your outpatient provider with current medication.  Also educated patient on the importance of having a good sleep schedule and generally taking care of her health  Comments: Prescriptions provided at discharge  Signed: Mordecai Rasmussen, MD 11/18/2019, 6:31 PM

## 2019-11-18 NOTE — Progress Notes (Signed)
  [facility] Adult Case Management Discharge Plan :  Will you be returning to the same living situation after discharge:  Yes,  lives with husband and children At discharge, do you have transportation home?: Yes,  husband will pick up Do you have the ability to pay for your medications: No.  Release of information consent forms completed and in the chart;  Patient's signature needed at discharge.  Patient to Follow up at: Follow-up Information    [facility] Follow up.   Why: Appointment is 12/05/2019 at 12pm. Contact information: [address] [place] [address] [place] [address] [phone]           Next level of care provider has access to [facility] Link:no  Safety Planning and Suicide Prevention discussed: Yes,  Horace Mathurin, husband  Have you used any form of tobacco in the last 30 days? (Cigarettes, Smokeless Tobacco, Cigars, and/or Pipes): Yes  Has patient been referred to the Quitline?: Patient refused referral  Patient has been referred for addiction treatment: N/A  Suzan Slick, LCSW 11/18/2019, 10:08 AM

## 2019-11-18 NOTE — Progress Notes (Signed)
Patient ID: Savannah [name], female   DOB: 12-23-1985, 34 y.o.   MRN: [id number]   Discharge Note:  Patient denies SI/HI/AVH at this time. Discharge instructions, AVS, prescriptions, transition record, and seven-day supply gone over with patient. Patient agrees to comply with medication management, follow-up visit, and outpatient therapy. Patient belongings returned to patient. Patient questions and concerns addressed and answered. Patient ambulatory off unit. Patient discharged to home with Fiance.

## 2019-11-18 NOTE — Progress Notes (Signed)
Recreation Therapy Notes  Date: 11/18/2019  Time: 9:30 am  Location: Craft Room  Behavioral response: Appropriate   Intervention Topic: Goals     Discussion/Intervention:  Group content on today was focused on goals. Patients described what goals are and how they define goals. Individuals expressed how they go about setting goals and reaching them. The group identified how important goals are and if they make short term goals to reach long term goals. Patients described how many goals they work on at a time and what affects them not reaching their goal. Individuals described how much time they put into planning and obtaining their goals. The group participated in the intervention "My Goal Board" and made personal goal boards to help them achieve their goal. Clinical Observations/Feedback:  Patient came to group and defined goals as putting your mind to completing things. She stated that her goal is to go back to school. Participant express that positive thoughts can help improve goal setting. Patient expressed that goals are important to help manage yourself. Individual was social with peers and staff while participating in the intervention during group.  Saga Balthazar LRT/CTRS           Harbor Vanover 11/18/2019 1:01 PM

## 2019-11-18 NOTE — Progress Notes (Signed)
Recreation Therapy Notes  INPATIENT RECREATION TR PLAN  Patient Details Name: Savannah [name] MRN: [id number] DOB: [birth year] Today's Date: 11/18/2019  Rec Therapy Plan Is patient appropriate for Therapeutic Recreation?: Yes Treatment times per week: at least 3 Estimated Length of Stay: 5-7 days TR Treatment/Interventions: Group participation (Comment)  Discharge Criteria Pt will be discharged from therapy if:: Discharged Treatment plan/goals/alternatives discussed and agreed upon by:: Patient/family  Discharge Summary Short term goals set: Patient will focus on task/topic with 2 prompts from staff within 5 recreation therapy group sessions ( Short term goals met: Complete Progress toward goals comments: Groups attended Which groups?: Communication, Goal setting Reason goals not met: N/A Therapeutic equipment acquired: N/A Reason patient discharged from therapy: Discharge from hospital Pt/family agrees with progress & goals achieved: Yes Date patient discharged from therapy: 11/18/19   Lowen Mansouri 11/18/2019, 2:46 PM

## 2020-10-12 ENCOUNTER — Telehealth (HOSPITAL_COMMUNITY): Payer: Self-pay | Admitting: *Deleted

## 2020-10-12 NOTE — Telephone Encounter (Signed)
Patient to call to set up appt.  Has not been seen since 11/2019.  Wants to restart meds. Denies SI and HI.

## 2020-11-27 ENCOUNTER — Telehealth (HOSPITAL_COMMUNITY): Payer: Medicaid Other | Admitting: Physician Assistant

## 2020-12-13 ENCOUNTER — Emergency Department (HOSPITAL_COMMUNITY)
Admission: EM | Admit: 2020-12-13 | Discharge: 2020-12-13 | Disposition: A | Discharge status: Home / Self Care | Payer: Medicaid Other | Attending: Emergency Medicine | Admitting: Emergency Medicine

## 2020-12-13 ENCOUNTER — Encounter (HOSPITAL_COMMUNITY): Payer: Self-pay | Admitting: Emergency Medicine

## 2020-12-13 ENCOUNTER — Other Ambulatory Visit: Payer: Self-pay

## 2020-12-13 DIAGNOSIS — B9620 Unspecified Escherichia coli [E. coli] as the cause of diseases classified elsewhere: Secondary | ICD-10-CM | POA: Diagnosis not present

## 2020-12-13 DIAGNOSIS — R000 Tachycardia, unspecified: Secondary | ICD-10-CM | POA: Diagnosis not present

## 2020-12-13 DIAGNOSIS — B349 Viral infection, unspecified: Secondary | ICD-10-CM

## 2020-12-13 DIAGNOSIS — U071 COVID-19: Secondary | ICD-10-CM | POA: Diagnosis not present

## 2020-12-13 DIAGNOSIS — R059 Cough, unspecified: Secondary | ICD-10-CM | POA: Diagnosis present

## 2020-12-13 DIAGNOSIS — R112 Nausea with vomiting, unspecified: Secondary | ICD-10-CM | POA: Diagnosis not present

## 2020-12-13 DIAGNOSIS — N390 Urinary tract infection, site not specified: Secondary | ICD-10-CM | POA: Insufficient documentation

## 2020-12-13 DIAGNOSIS — E669 Obesity, unspecified: Secondary | ICD-10-CM | POA: Diagnosis not present

## 2020-12-13 DIAGNOSIS — F17210 Nicotine dependence, cigarettes, uncomplicated: Secondary | ICD-10-CM | POA: Insufficient documentation

## 2020-12-13 LAB — CBC WITH DIFFERENTIAL/PLATELET
Abs Immature Granulocytes: 0.03 10*3/uL (ref 0.00–0.07)
Basophils Absolute: 0 10*3/uL (ref 0.0–0.1)
Basophils Relative: 1 %
Eosinophils Absolute: 0 10*3/uL (ref 0.0–0.5)
Eosinophils Relative: 0 %
HCT: 44.3 % (ref 36.0–46.0)
Hemoglobin: 14 g/dL (ref 12.0–15.0)
Immature Granulocytes: 1 %
Lymphocytes Relative: 18 %
Lymphs Abs: 0.6 10*3/uL — ABNORMAL LOW (ref 0.7–4.0)
MCH: 26.8 pg (ref 26.0–34.0)
MCHC: 31.6 g/dL (ref 30.0–36.0)
MCV: 84.7 fL (ref 80.0–100.0)
Monocytes Absolute: 1 10*3/uL (ref 0.1–1.0)
Monocytes Relative: 28 %
Neutro Abs: 1.9 10*3/uL (ref 1.7–7.7)
Neutrophils Relative %: 52 %
Platelets: 203 10*3/uL (ref 150–400)
RBC: 5.23 MIL/uL — ABNORMAL HIGH (ref 3.87–5.11)
RDW: 15.4 % (ref 11.5–15.5)
WBC: 3.5 10*3/uL — ABNORMAL LOW (ref 4.0–10.5)
nRBC: 0 % (ref 0.0–0.2)

## 2020-12-13 LAB — COMPREHENSIVE METABOLIC PANEL
ALT: 24 U/L (ref 0–44)
AST: 31 U/L (ref 15–41)
Albumin: 3.9 g/dL (ref 3.5–5.0)
Alkaline Phosphatase: 75 U/L (ref 38–126)
Anion gap: 6 (ref 5–15)
BUN: 11 mg/dL (ref 6–20)
CO2: 23 mmol/L (ref 22–32)
Calcium: 8.7 mg/dL — ABNORMAL LOW (ref 8.9–10.3)
Chloride: 106 mmol/L (ref 98–111)
Creatinine, Ser: 0.86 mg/dL (ref 0.44–1.00)
GFR, Estimated: >60 mL/min (ref >60)
Glucose, Bld: 108 mg/dL — ABNORMAL HIGH (ref 70–99)
Potassium: 3.5 mmol/L (ref 3.5–5.1)
Sodium: 135 mmol/L (ref 135–145)
Total Bilirubin: 0.3 mg/dL (ref 0.3–1.2)
Total Protein: 7.7 g/dL (ref 6.5–8.1)

## 2020-12-13 LAB — URINALYSIS, ROUTINE W REFLEX MICROSCOPIC
Bilirubin Urine: NEGATIVE
Glucose, UA: NEGATIVE mg/dL
Hgb urine dipstick: NEGATIVE
Ketones, ur: 5 mg/dL — AB
Nitrite: NEGATIVE
Protein, ur: NEGATIVE mg/dL
Specific Gravity, Urine: 1.015 (ref 1.005–1.030)
pH: 5 (ref 5.0–8.0)

## 2020-12-13 LAB — POC URINE PREG, ED: Preg Test, Ur: NEGATIVE

## 2020-12-13 LAB — LIPASE, BLOOD: Lipase: 54 U/L — ABNORMAL HIGH (ref 11–51)

## 2020-12-13 MED ORDER — CEPHALEXIN 500 MG PO CAPS: 500.0000 mg | ORAL_CAPSULE | Freq: Two times a day (BID) | ORAL | 0 refills | Status: AC

## 2020-12-13 MED ORDER — ONDANSETRON HCL 4 MG/2ML IJ SOLN
4.0000 mg | Freq: Once | INTRAMUSCULAR | Status: AC
  Administered 2020-12-13: 4 mg via INTRAVENOUS
  Filled 2020-12-13: qty 2

## 2020-12-13 MED ORDER — SODIUM CHLORIDE 0.9 % IV BOLUS
1000.0000 mL | Freq: Once | INTRAVENOUS | Status: AC
  Administered 2020-12-13: 1000 mL via INTRAVENOUS

## 2020-12-13 MED ORDER — ONDANSETRON HCL 4 MG PO TABS: 4.0000 mg | ORAL_TABLET | Freq: Three times a day (TID) | ORAL | 0 refills | Status: AC | PRN

## 2020-12-13 NOTE — ED Notes (Signed)
Venter PA-C to bedside at this time

## 2020-12-13 NOTE — Discharge Instructions (Addendum)
Please self isolate and await your COVID test results. We will call you if you test positive. You can also log into MyChart and check the results that way.   Your urine did appear to have bacteria in it today. We have sent your urine for culture. I have sent in antibiotics for you to treat a urinary tract infection as you could be having symptoms related to that as well. You will need to have your urine retested in 1 week to ensure the infection has resolved.   Pick up nausea medication and take as needed. Drink plenty of fluids to stay hydrated.   Follow up with your PCP regarding your ED visit  Return to the ED for any new/worsening symptoms

## 2020-12-13 NOTE — ED Provider Notes (Signed)
[facility]-EMERGENCY DEPT Provider Note   CSN: [id number] Arrival date & time: 12/13/20  1709     History Chief Complaint  Patient presents with  . Emesis    Savannah [name] is a 35 y.o. female who presents to the ED today with complaint of nausea, NBNB emesis, diarrhea, mild cough, body aches, and fatigue for the past 2 days.  Patient also reports subjective fevers.  She has been able to tolerate a small amount of water at home however has not tried to eat anything today.  The last time she vomited was yesterday.  Continues to have diarrhea.  She denies any recent sick contacts.  She recently started school and is unsure if anyone around her has been sick however.  She is vaccinated with [facility] vaccine, received a booster 8 months ago.  Patient reports mild abdominal cramping yesterday however none today.  Denies headache, neck stiffness, rash, chest pain, shortness of breath, urinary symptoms, vaginal discharge, pelvic pain, any other associated symptoms.  Unsure when her last normal menstrual cycle was.  She is sexually active.   The history is provided by the patient and medical records.       Past Medical History:  Diagnosis Date  . Depression   . Kidney infection     Patient Active Problem List   Diagnosis Date Noted  . Bipolar disorder, unspecified (HCC) 11/14/2019  . Agitation   . Suicidal ideation   . Bipolar I disorder, current or most recent episode manic, with psychotic features (HCC)   . Schizophrenia (HCC) 07/31/2019  . Psychosis (HCC) 09/28/2013  . 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 No pertinent surgical history.   OB History   No obstetric history on file.     Family History  Problem Relation Age of Onset  . Diabetes Other   . Hypertension Other   . Stroke Other     Social History   Tobacco Use  . Smoking status: Current Every Day Smoker    Packs/day: 0.30    Types: Cigarettes  .  Smokeless tobacco: Never Used  Vaping Use  . Vaping Use: Unknown  Substance Use Topics  . Alcohol use: Yes    Comment: Occasional Drinker  . Drug use: Yes    Types: Marijuana    Home Medications Prior to Admission medications   Medication Sig Start Date End Date Taking? Authorizing Provider  cephALEXin (KEFLEX) 500 MG capsule Take 1 capsule (500 mg total) by mouth 2 (two) times daily for 5 days. 12/13/20 12/18/20 Yes Jacoba Cherney, PA-C  ondansetron (ZOFRAN) 4 MG tablet Take 1 tablet (4 mg total) by mouth every 8 (eight) hours as needed for nausea or vomiting. 12/13/20  Yes Alyne Martinson, PA-C  albuterol (VENTOLIN HFA) 108 (90 Base) MCG/ACT inhaler Inhale 2 puffs into the lungs every 6 (six) hours as needed for wheezing or shortness of breath. Patient not taking: No sig reported 11/18/19   Clapacs, [name], MD  benztropine (COGENTIN) 1 MG tablet Take 1 tablet (1 mg total) by mouth 2 (two) times daily. Patient not taking: No sig reported 11/18/19   Clapacs, [name], MD  haloperidol (HALDOL) 10 MG tablet Take 1 tablet (10 mg total) by mouth at bedtime. Patient not taking: No sig reported 11/18/19   Clapacs, [name], MD  traZODone (DESYREL) 50 MG tablet Take 1 tablet (50 mg total) by mouth at bedtime as needed for sleep. Patient not taking: No  sig reported 11/18/19   Clapacs, [name], MD    Allergies    Sudafed [pseudoephedrine]  Review of Systems   Review of Systems  Constitutional: Positive for chills, fatigue and fever.  Respiratory: Positive for cough. Negative for shortness of breath.   Cardiovascular: Negative for chest pain.  Gastrointestinal: Positive for diarrhea, nausea and vomiting.  Genitourinary: Negative for dysuria, pelvic pain and vaginal discharge.  Musculoskeletal: Positive for myalgias.  Neurological: Negative for headaches.  All other systems reviewed and are negative.   Physical Exam Updated Vital Signs BP (!) 135/109   Pulse (!) 112   Temp 99.9 F (37.7 C)  (Oral)   Resp 18   SpO2 98%   Physical Exam Vitals and nursing note reviewed.  Constitutional:      Appearance: She is obese. She is not ill-appearing or diaphoretic.  HENT:     Head: Normocephalic and atraumatic.     Mouth/Throat:     Mouth: Mucous membranes are dry.  Eyes:     Conjunctiva/sclera: Conjunctivae normal.  Cardiovascular:     Rate and Rhythm: Regular rhythm. Tachycardia present.     Pulses: Normal pulses.  Pulmonary:     Effort: Pulmonary effort is normal.     Breath sounds: Normal breath sounds. No wheezing, rhonchi or rales.     Comments: Speaking in full sentences without difficulty. Satting 98% on RA. LCTAB.  Abdominal:     Palpations: Abdomen is soft.     Tenderness: There is no abdominal tenderness. There is no guarding or rebound.  Musculoskeletal:     Cervical back: Neck supple.  Skin:    General: Skin is warm and dry.  Neurological:     Mental Status: She is alert.     ED Results / Procedures / Treatments   Labs (all labs ordered are listed, but only abnormal results are displayed) Labs Reviewed  COMPREHENSIVE METABOLIC PANEL - Abnormal; Notable for the following components:      Result Value   Glucose, Bld 108 (*)    Calcium 8.7 (*)    All other components within normal limits  CBC WITH DIFFERENTIAL/PLATELET - Abnormal; Notable for the following components:   WBC 3.5 (*)    RBC 5.23 (*)    Lymphs Abs 0.6 (*)    All other components within normal limits  LIPASE, BLOOD - Abnormal; Notable for the following components:   Lipase 54 (*)    All other components within normal limits  URINALYSIS, ROUTINE W REFLEX MICROSCOPIC - Abnormal; Notable for the following components:   APPearance HAZY (*)    Ketones, ur 5 (*)    Leukocytes,Ua SMALL (*)    Bacteria, UA MANY (*)    Crystals PRESENT (*)    All other components within normal limits  SARS CORONAVIRUS 2 (TAT 6-24 HRS)  URINE CULTURE  POC URINE PREG, ED    EKG None  Radiology No  results found.  Procedures Procedures   Medications Ordered in ED Medications  sodium chloride 0.9 % bolus 1,000 mL (0 mLs Intravenous Stopped 12/13/20 1953)  ondansetron (ZOFRAN) injection 4 mg (4 mg Intravenous Given 12/13/20 1753)    ED Course  I have reviewed the triage vital signs and the nursing notes.  Pertinent labs & imaging results that were available during my care of the patient were reviewed by me and considered in my medical decision making (see chart for details).    MDM Rules/Calculators/A&P  35 year old female presents to the ED today with complaints of cough, body aches, nausea, vomiting, diarrhea, subjective fevers for the past 2 days.  Vaccinated for COVID.  Denies recent sick contacts.  On arrival to the ED patient's temp is 99.9.  She is tachycardic at 112.  Nontachypneic.  Satting 98% on room air.  She appears to be in no acute distress at this time.  Nontoxic-appearing.  She has no abdominal tenderness palpation on exam today.  Lungs clear to auscultation bilaterally.  Speaking full sentences without difficulty.  Suspect viral illness including COVID at this time.  Given her complaint of abdominal cramping yesterday will work-up for same with labs, fluids, antiemetics.  Will swab with send out COVID test.  Will plan to fluid challenge patient prior to discharge and have her await on her COVID test results.  I do not feel she needs any abdominal imaging at this time however will follow labs and if any acute abnormalities may consider.  CBC with leukopenia 3.5 and lymphopenia. Hgb stable 5.23 CMP with glucose 108. No other electrolyte abnormalities. LFTs unremarkable.  Lipase 54; suspect elevated s/2 dehydration. Not elevated 3X ULN to suggest pancreatitis today.   UPT  Negative U/A with small leuks, 21-50 WBCs, many bacteria. Only 6-10 squamous epithelial cells. Will send for culture and treat for UTI.   Pt able to tolerate crackers in the ED  without difficulty. Will discharge home at this time with zofran and instructions to await COVID test results. Pt to follow up with PCP for same. She is in agreement with plan and stable for discharge home.   This note was prepared using Dragon voice recognition software and may include unintentional dictation errors due to the inherent limitations of voice recognition software.  Final Clinical Impression(s) / ED Diagnoses Final diagnoses:  Viral illness  Lower urinary tract infectious disease    Rx / DC Orders ED Discharge Orders         Ordered    ondansetron (ZOFRAN) 4 MG tablet  Every 8 hours PRN        12/13/20 2101    cephALEXin (KEFLEX) 500 MG capsule  2 times daily        12/13/20 2126           Discharge Instructions     Please self isolate and await your COVID test results. We will call you if you test positive. You can also log into MyChart and check the results that way.   Your urine did appear to have bacteria in it today. We have sent your urine for culture. I have sent in antibiotics for you to treat a urinary tract infection as you could be having symptoms related to that as well. You will need to have your urine retested in 1 week to ensure the infection has resolved.   Pick up nausea medication and take as needed. Drink plenty of fluids to stay hydrated.   Follow up with your PCP regarding your ED visit  Return to the ED for any new/worsening symptoms       Milinda Antis 12/13/20 2127    Cheryll Cockayne, MD 12/18/20 2354

## 2020-12-13 NOTE — ED Triage Notes (Signed)
Patient reports vomiting and fever x2 days. Denies pain.

## 2020-12-13 NOTE — ED Notes (Signed)
Patient provided with crackers per request.

## 2020-12-14 LAB — SARS CORONAVIRUS 2 (TAT 6-24 HRS): SARS Coronavirus 2: POSITIVE — AB

## 2020-12-16 ENCOUNTER — Telehealth: Payer: Self-pay | Admitting: Adult Health

## 2020-12-16 LAB — URINE CULTURE: Culture: >=100000 — AB

## 2020-12-16 NOTE — Telephone Encounter (Signed)
Called to discuss with patient about COVID-19 symptoms and the use of one of the available treatments for those with mild to moderate Covid symptoms and at a high risk of hospitalization.  Pt appears to qualify for outpatient treatment due to co-morbid conditions and/or a member of an at-risk group in accordance with the FDA Emergency Use Authorization.   Unable to reach pt - unable to [facility]   Noreene Filbert

## 2020-12-17 ENCOUNTER — Telehealth: Payer: Self-pay | Admitting: *Deleted

## 2020-12-17 NOTE — Telephone Encounter (Signed)
Post ED Visit - Positive Culture Follow-up  Culture report reviewed by antimicrobial stewardship pharmacist: [facility] []  , Pharm.D. []  Enzo Bi, Pharm.D., BCPS AQ-ID []  , Pharm.D., BCPS []  Celedonio Miyamoto, Pharm.D., BCPS []  [place], Garvin Fila.D., BCPS, AAHIVP []  , Pharm.D., BCPS, AAHIVP []  Georgina Pillion, PharmD, BCPS []  , PharmD, BCPS []  [place], PharmD, BCPS []  [address], PharmD []  , PharmD, BCPS []  Estella Husk, PharmD  Pharmacy Team []  Lysle Pearl, PharmD []  , PharmD []  Phillips Climes, PharmD []  , Rph []  Agapito Games) , PharmD []  Verlan Friends, PharmD []  , PharmD []  Mervyn Gay, PharmD []  , PharmD []  Vinnie Level, PharmD []  [facility], PharmD []  , PharmD []  Len Childs, PharmD   Positive urine culture Treated with Cephalexin, organism sensitive to the same and no further patient follow-up is required at this time.  , MD  Greer Pickerel Talley 12/17/2020, 10:48 AM

## 2021-10-06 ENCOUNTER — Other Ambulatory Visit: Payer: Self-pay

## 2021-10-06 ENCOUNTER — Emergency Department (HOSPITAL_COMMUNITY)
Admission: EM | Admit: 2021-10-06 | Discharge: 2021-10-06 | Disposition: A | Discharge status: Home / Self Care | Payer: Medicaid Other | Attending: Emergency Medicine | Admitting: Emergency Medicine

## 2021-10-06 ENCOUNTER — Encounter (HOSPITAL_COMMUNITY): Payer: Self-pay | Admitting: *Deleted

## 2021-10-06 DIAGNOSIS — R109 Unspecified abdominal pain: Secondary | ICD-10-CM

## 2021-10-06 DIAGNOSIS — N9489 Other specified conditions associated with female genital organs and menstrual cycle: Secondary | ICD-10-CM | POA: Insufficient documentation

## 2021-10-06 LAB — URINALYSIS, ROUTINE W REFLEX MICROSCOPIC
Bilirubin Urine: NEGATIVE
Glucose, UA: NEGATIVE mg/dL
Hgb urine dipstick: NEGATIVE
Ketones, ur: NEGATIVE mg/dL
Leukocytes,Ua: NEGATIVE
Nitrite: NEGATIVE
Protein, ur: NEGATIVE mg/dL
Specific Gravity, Urine: 1.012 (ref 1.005–1.030)
pH: 5 (ref 5.0–8.0)

## 2021-10-06 LAB — CBC WITH DIFFERENTIAL/PLATELET
Abs Immature Granulocytes: 0.01 10*3/uL (ref 0.00–0.07)
Basophils Absolute: 0 10*3/uL (ref 0.0–0.1)
Basophils Relative: 0 %
Eosinophils Absolute: 0.1 10*3/uL (ref 0.0–0.5)
Eosinophils Relative: 2 %
HCT: 43.9 % (ref 36.0–46.0)
Hemoglobin: 13.4 g/dL (ref 12.0–15.0)
Immature Granulocytes: 0 %
Lymphocytes Relative: 35 %
Lymphs Abs: 2.4 10*3/uL (ref 0.7–4.0)
MCH: 25.6 pg — ABNORMAL LOW (ref 26.0–34.0)
MCHC: 30.5 g/dL (ref 30.0–36.0)
MCV: 83.8 fL (ref 80.0–100.0)
Monocytes Absolute: 0.6 10*3/uL (ref 0.1–1.0)
Monocytes Relative: 9 %
Neutro Abs: 3.6 10*3/uL (ref 1.7–7.7)
Neutrophils Relative %: 54 %
Platelets: 277 10*3/uL (ref 150–400)
RBC: 5.24 MIL/uL — ABNORMAL HIGH (ref 3.87–5.11)
RDW: 16 % — ABNORMAL HIGH (ref 11.5–15.5)
WBC: 6.7 10*3/uL (ref 4.0–10.5)
nRBC: 0 % (ref 0.0–0.2)

## 2021-10-06 LAB — COMPREHENSIVE METABOLIC PANEL
ALT: 14 U/L (ref 0–44)
AST: 21 U/L (ref 15–41)
Albumin: 4.2 g/dL (ref 3.5–5.0)
Alkaline Phosphatase: 79 U/L (ref 38–126)
Anion gap: 6 (ref 5–15)
BUN: 14 mg/dL (ref 6–20)
CO2: 24 mmol/L (ref 22–32)
Calcium: 9.2 mg/dL (ref 8.9–10.3)
Chloride: 105 mmol/L (ref 98–111)
Creatinine, Ser: 0.77 mg/dL (ref 0.44–1.00)
GFR, Estimated: >60 mL/min (ref >60)
Glucose, Bld: 108 mg/dL — ABNORMAL HIGH (ref 70–99)
Potassium: 3.9 mmol/L (ref 3.5–5.1)
Sodium: 135 mmol/L (ref 135–145)
Total Bilirubin: 0.5 mg/dL (ref 0.3–1.2)
Total Protein: 7.7 g/dL (ref 6.5–8.1)

## 2021-10-06 LAB — I-STAT BETA HCG BLOOD, ED (MC, WL, AP ONLY): I-stat hCG, quantitative: <5 m[IU]/mL (ref <5)

## 2021-10-06 LAB — HCG, QUANTITATIVE, PREGNANCY: hCG, Beta Chain, Quant, S: <1 m[IU]/mL (ref <5)

## 2021-10-06 NOTE — ED Provider Notes (Signed)
?[facility]-EMERGENCY DEPT ?Provider Note ? ? ?CSN: [id number] ?Arrival date & time: 10/06/21  1800 ? ?  ? ?History ? ?Chief Complaint  ?Patient presents with  ? Abdominal Pain  ? ? ?Savannah [name] is a 36 y.o. female. ? ?Patient is a 36 year old female with a history of depression who is presenting today with complaints of intermittent abdominal cramping.  She reports she took a pregnancy test yesterday and it was positive and she is just here for confirmatory test.  She denies any abdominal pain at this time.  She does report she is trying to conceive so is not using protection but denies any dysuria, frequency, urgency, vaginal bleeding or vaginal discharge.  Last period was January 17.  She has no other complaints at this time ? ?The history is provided by the patient.  ?Abdominal Pain ? ?  ? ?Home Medications ?Prior to Admission medications   ?Medication Sig Start Date End Date Taking? Authorizing Provider  ?albuterol (VENTOLIN HFA) 108 (90 Base) MCG/ACT inhaler Inhale 2 puffs into the lungs every 6 (six) hours as needed for wheezing or shortness of breath. ?Patient not taking: No sig reported 11/18/19   Clapacs, [name], MD  ?benztropine (COGENTIN) 1 MG tablet Take 1 tablet (1 mg total) by mouth 2 (two) times daily. ?Patient not taking: No sig reported 11/18/19   Clapacs, [name], MD  ?haloperidol (HALDOL) 10 MG tablet Take 1 tablet (10 mg total) by mouth at bedtime. ?Patient not taking: No sig reported 11/18/19   Clapacs, [name], MD  ?ondansetron (ZOFRAN) 4 MG tablet Take 1 tablet (4 mg total) by mouth every 8 (eight) hours as needed for nausea or vomiting. 12/13/20   Tanda Rockers, PA-C  ?traZODone (DESYREL) 50 MG tablet Take 1 tablet (50 mg total) by mouth at bedtime as needed for sleep. ?Patient not taking: No sig reported 11/18/19   Clapacs, [name], MD  ?   ? ?Allergies    ?Sudafed [pseudoephedrine]   ? ?Review of Systems   ?Review of Systems  ?Gastrointestinal:  Positive for abdominal pain.   ? ?Physical Exam ?Updated Vital Signs ?BP (!) 152/86 (BP Location: Right Arm)   Pulse 94   Temp 97.7 ?F (36.5 ?C) (Oral)   Resp 16   Ht 5\' 2"  (1.575 m)   Wt (!) 142.9 kg   LMP 08/24/2021   SpO2 100%   BMI 57.61 kg/m?  ?Physical Exam ?Vitals and nursing note reviewed.  ?Constitutional:   ?   General: She is not in acute distress. ?   Appearance: She is well-developed.  ?HENT:  ?   Head: Normocephalic and atraumatic.  ?Eyes:  ?   Pupils: Pupils are equal, round, and reactive to light.  ?Cardiovascular:  ?   Rate and Rhythm: Normal rate and regular rhythm.  ?   Heart sounds: Normal heart sounds. No murmur heard. ?  No friction rub.  ?Pulmonary:  ?   Effort: Pulmonary effort is normal.  ?   Breath sounds: Normal breath sounds. No wheezing or rales.  ?Abdominal:  ?   General: Bowel sounds are normal. There is no distension.  ?   Palpations: Abdomen is soft.  ?   Tenderness: There is no abdominal tenderness. There is no guarding or rebound.  ?Musculoskeletal:     ?   General: No tenderness. Normal range of motion.  ?   Comments: No edema  ?Skin: ?   General: Skin is warm and dry.  ?  Findings: No rash.  ?Neurological:  ?   Mental Status: She is alert and oriented to person, place, and time.  ?   Cranial Nerves: No cranial nerve deficit.  ?Psychiatric:     ?   Behavior: Behavior normal.  ? ? ?ED Results / Procedures / Treatments   ?Labs ?(all labs ordered are listed, but only abnormal results are displayed) ?Labs Reviewed  ?CBC WITH DIFFERENTIAL/PLATELET - Abnormal; Notable for the following components:  ?    Result Value  ? RBC 5.24 (*)   ? MCH 25.6 (*)   ? RDW 16.0 (*)   ? All other components within normal limits  ?URINALYSIS, ROUTINE W REFLEX MICROSCOPIC - Abnormal; Notable for the following components:  ? APPearance CLOUDY (*)   ? All other components within normal limits  ?COMPREHENSIVE METABOLIC PANEL - Abnormal; Notable for the following components:  ? Glucose, Bld 108 (*)   ? All other components within  normal limits  ?HCG, QUANTITATIVE, PREGNANCY  ?I-STAT BETA HCG BLOOD, ED ([facility], [facility], [facility] ONLY)  ? ? ?EKG ?None ? ?Radiology ?No results found. ? ?Procedures ?Procedures  ? ? ?Medications Ordered in ED ?Medications - No data to display ? ?ED Course/ Medical Decision Making/ A&P ?  ?                        ?Medical Decision Making ? ?Patient presenting today with complaint of intermittent abdominal cramping.  She is currently asymptomatic and reports she is just here to have a confirmatory pregnancy test so she can follow-up with OB/GYN.  Patient has no specific findings on physical exam except that she has hypertension.  I independently interpreted patient's labs and she does not appear to be pregnant today with a quantitative hCG of less than 1.  Her CBC today without acute findings, CMP within normal limits and UA is within normal limits.  Findings were discussed with the patient and she is in disbelief and feels that our tests are wrong because her urine pregnancy test yesterday was positive.  Discussed with her that the blood test were 0.  Encouraged her to follow-up with OB/GYN as she has been attempting to conceive and cannot so that she can have a further work-up if needed.  No indication for further work-up today. ? ? ? ? ? ? ? ?Final Clinical Impression(s) / ED Diagnoses ?Final diagnoses:  ?Abdominal cramps  ? ? ?Rx / DC Orders ?ED Discharge Orders   ? ? None  ? ?  ? ? ?  ?Gwyneth Sprout, MD ?10/06/21 2026 ? ?

## 2021-10-06 NOTE — ED Triage Notes (Signed)
+   home pregnancy test, wants confirmation, having some abd cramping. Last cycle 08/25/21 ?

## 2021-10-06 NOTE — Discharge Instructions (Signed)
The blood test today were negative and it does not appear that you are pregnant at this time. ?

## 2021-10-20 ENCOUNTER — Ambulatory Visit (HOSPITAL_COMMUNITY)
Admission: EM | Admit: 2021-10-20 | Discharge: 2021-10-20 | Disposition: A | Discharge status: Home / Self Care | Payer: Medicaid Other | Attending: Nurse Practitioner | Admitting: Nurse Practitioner

## 2021-10-20 ENCOUNTER — Other Ambulatory Visit: Payer: Self-pay

## 2021-10-20 ENCOUNTER — Encounter (HOSPITAL_COMMUNITY): Payer: Self-pay

## 2021-10-20 DIAGNOSIS — N912 Amenorrhea, unspecified: Secondary | ICD-10-CM

## 2021-10-20 LAB — HCG, QUANTITATIVE, PREGNANCY: hCG, Beta Chain, Quant, S: <1 m[IU]/mL (ref <5)

## 2021-10-20 LAB — POC URINE PREG, ED: Preg Test, Ur: NEGATIVE

## 2021-10-20 NOTE — Discharge Instructions (Addendum)
-   the urine pregnancy test today is negative.  ?- we will call you if the blood pregnancy test is positive.  ?- please call the number provided to establish care with an OB/GYN ?

## 2021-10-20 NOTE — ED Provider Notes (Signed)
?[facility] ? ? ? ?CSN: [id number] ?Arrival date & time: 10/20/21  3149 ? ? ?  ? ?History   ?Chief Complaint ?Chief Complaint  ?Patient presents with  ? Possible Pregnancy  ? ? ?HPI ?Savannah [name] is a 36 y.o. female.  ? ?Patient here for pregnancy test.  Reports she had a positive home test on 10/05/21.  Patient reports headache, nausea, cramping.  Denies vaginal discharge, spotting.  She is eating and drinking well.  LMP in January.  Has not seen OB/GYN, has never been pregnant.   She is currently trying to conceive.  ? ? ? ?Past Medical History:  ?Diagnosis Date  ? Depression   ? Kidney infection   ? ? ?Patient Active Problem List  ? Diagnosis Date Noted  ? Bipolar disorder, unspecified (HCC) 11/14/2019  ? Agitation   ? Suicidal ideation   ? Bipolar I disorder, current or most recent episode manic, with psychotic features (HCC)   ? Schizophrenia (HCC) 07/31/2019  ? Psychosis (HCC) 09/28/2013  ? 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 ? ?History reviewed. No pertinent surgical history. ? ?OB History   ?No obstetric history on file. ?  ? ? ? ?Home Medications   ? ?Prior to Admission medications   ?Medication Sig Start Date End Date Taking? Authorizing Provider  ?albuterol (VENTOLIN HFA) 108 (90 Base) MCG/ACT inhaler Inhale 2 puffs into the lungs every 6 (six) hours as needed for wheezing or shortness of breath. ?Patient not taking: No sig reported 11/18/19   Clapacs, [name], MD  ?benztropine (COGENTIN) 1 MG tablet Take 1 tablet (1 mg total) by mouth 2 (two) times daily. ?Patient not taking: No sig reported 11/18/19   Clapacs, [name], MD  ?haloperidol (HALDOL) 10 MG tablet Take 1 tablet (10 mg total) by mouth at bedtime. ?Patient not taking: No sig reported 11/18/19   Clapacs, [name], MD  ?ondansetron (ZOFRAN) 4 MG tablet Take 1 tablet (4 mg total) by mouth every 8 (eight) hours as needed for nausea or vomiting. 12/13/20   Tanda Rockers, PA-C  ?traZODone (DESYREL) 50 MG tablet Take 1 tablet (50 mg  total) by mouth at bedtime as needed for sleep. ?Patient not taking: No sig reported 11/18/19   Clapacs, [name], MD  ? ? ?Family History ?Family History  ?Problem Relation Age of Onset  ? Diabetes Other   ? Hypertension Other   ? Stroke Other   ? ? ?Social History ?Social History  ? ?Tobacco Use  ? Smoking status: Former  ?  Packs/day: 0.30  ?  Types: Cigarettes  ? Smokeless tobacco: Never  ?Vaping Use  ? Vaping Use: Unknown  ?Substance Use Topics  ? Alcohol use: Yes  ?  Comment: Occasional Drinker  ? Drug use: Not Currently  ?  Types: Marijuana  ? ? ? ?Allergies   ?Sudafed [pseudoephedrine] ? ? ?Review of Systems ?Review of Systems ?Per HPI ? ?Physical Exam ?Triage Vital Signs ?ED Triage Vitals [10/20/21 0953]  ?Enc Vitals Group  ?   BP (!) 144/97  ?   Pulse Rate 89  ?   Resp 16  ?   Temp 97.9 ?F (36.6 ?C)  ?   Temp Source Oral  ?   SpO2 100 %  ?   Weight   ?   Height   ?   Head Circumference   ?   Peak Flow   ?   Pain Score   ?   Pain Loc   ?  Pain Edu?   ?   Excl. in GC?   ? ?No data found. ? ?Updated Vital Signs ?BP (!) 144/97 (BP Location: Left Arm)   Pulse 89   Temp 97.9 ?F (36.6 ?C) (Oral)   Resp 16   LMP 09/04/2021 (Approximate)   SpO2 100%  ? ?Visual Acuity ?Right Eye Distance:   ?Left Eye Distance:   ?Bilateral Distance:   ? ?Right Eye Near:   ?Left Eye Near:    ?Bilateral Near:    ? ?Physical Exam ? ? ?UC Treatments / Results  ?Labs ?(all labs ordered are listed, but only abnormal results are displayed) ?Labs Reviewed  ?HCG, QUANTITATIVE, PREGNANCY  ?POC URINE PREG, ED  ? ? ?EKG ? ? ?Radiology ?No results found. ? ?Procedures ?Procedures (including critical care time) ? ?Medications Ordered in UC ?Medications - No data to display ? ?Initial Impression / Assessment and Plan / UC Course  ?I have reviewed the triage vital signs and the nursing notes. ? ?Pertinent labs & imaging results that were available during my care of the patient were reviewed by me and considered in my medical decision making (see  chart for details). ? ?  ?Urine pregnancy test is negative today.  Discussed with the patient; she is surprised and requests blood testing.  Hcg blood checked.  Encouraged establishing care with OB/GYN for further testing if Hcg negative. ?Final Clinical Impressions(s) / UC Diagnoses  ? ?Final diagnoses:  ?Amenorrhea  ? ? ? ?Discharge Instructions   ? ?  ?- the urine pregnancy test today is negative.  ?- we will call you if the blood pregnancy test is positive.  ?- please call the number provided to establish care with an OB/GYN ? ? ? ? ?ED Prescriptions   ?None ?  ? ?PDMP not reviewed this encounter. ?  ?Valentino Nose, NP ?10/20/21 1026 ? ?

## 2021-10-20 NOTE — ED Triage Notes (Signed)
Pt reports +home pregnancy test on 10/05/2021. She would like confirmation, reports some abdominal pain. ?Last cycle 08/2021 ?

## 2023-10-21 ENCOUNTER — Other Ambulatory Visit: Payer: Self-pay

## 2023-10-21 ENCOUNTER — Emergency Department (HOSPITAL_COMMUNITY)
Admission: EM | Admit: 2023-10-21 | Discharge: 2023-10-21 | Disposition: A | Discharge status: Home / Self Care | Attending: Emergency Medicine | Admitting: Emergency Medicine

## 2023-10-21 ENCOUNTER — Emergency Department (HOSPITAL_COMMUNITY)

## 2023-10-21 ENCOUNTER — Encounter (HOSPITAL_COMMUNITY): Payer: Self-pay

## 2023-10-21 DIAGNOSIS — J029 Acute pharyngitis, unspecified: Secondary | ICD-10-CM

## 2023-10-21 DIAGNOSIS — R112 Nausea with vomiting, unspecified: Secondary | ICD-10-CM | POA: Diagnosis not present

## 2023-10-21 DIAGNOSIS — J351 Hypertrophy of tonsils: Secondary | ICD-10-CM | POA: Insufficient documentation

## 2023-10-21 DIAGNOSIS — R109 Unspecified abdominal pain: Secondary | ICD-10-CM | POA: Insufficient documentation

## 2023-10-21 DIAGNOSIS — J36 Peritonsillar abscess: Secondary | ICD-10-CM | POA: Insufficient documentation

## 2023-10-21 DIAGNOSIS — R519 Headache, unspecified: Secondary | ICD-10-CM | POA: Insufficient documentation

## 2023-10-21 DIAGNOSIS — R509 Fever, unspecified: Secondary | ICD-10-CM | POA: Diagnosis present

## 2023-10-21 DIAGNOSIS — R1900 Intra-abdominal and pelvic swelling, mass and lump, unspecified site: Secondary | ICD-10-CM | POA: Diagnosis not present

## 2023-10-21 DIAGNOSIS — Z3202 Encounter for pregnancy test, result negative: Secondary | ICD-10-CM | POA: Insufficient documentation

## 2023-10-21 LAB — CBC WITH DIFFERENTIAL/PLATELET
Abs Immature Granulocytes: 0.15 10*3/uL — ABNORMAL HIGH (ref 0.00–0.07)
Basophils Absolute: 0 10*3/uL (ref 0.0–0.1)
Basophils Relative: 0 %
Eosinophils Absolute: 0 10*3/uL (ref 0.0–0.5)
Eosinophils Relative: 0 %
HCT: 46.3 % — ABNORMAL HIGH (ref 36.0–46.0)
Hemoglobin: 14.7 g/dL (ref 12.0–15.0)
Immature Granulocytes: 1 %
Lymphocytes Relative: 6 %
Lymphs Abs: 1.1 10*3/uL (ref 0.7–4.0)
MCH: 26.7 pg (ref 26.0–34.0)
MCHC: 31.7 g/dL (ref 30.0–36.0)
MCV: 84.2 fL (ref 80.0–100.0)
Monocytes Absolute: 0.9 10*3/uL (ref 0.1–1.0)
Monocytes Relative: 4 %
Neutro Abs: 18.1 10*3/uL — ABNORMAL HIGH (ref 1.7–7.7)
Neutrophils Relative %: 89 %
Platelets: 307 10*3/uL (ref 150–400)
RBC: 5.5 MIL/uL — ABNORMAL HIGH (ref 3.87–5.11)
RDW: 15.5 % (ref 11.5–15.5)
WBC: 20.3 10*3/uL — ABNORMAL HIGH (ref 4.0–10.5)
nRBC: 0 % (ref 0.0–0.2)

## 2023-10-21 LAB — URINALYSIS, ROUTINE W REFLEX MICROSCOPIC
Bilirubin Urine: NEGATIVE
Glucose, UA: NEGATIVE mg/dL
Hgb urine dipstick: NEGATIVE
Ketones, ur: 20 mg/dL — AB
Leukocytes,Ua: NEGATIVE
Nitrite: NEGATIVE
Protein, ur: NEGATIVE mg/dL
Specific Gravity, Urine: >1.046 — ABNORMAL HIGH (ref 1.005–1.030)
pH: 8 (ref 5.0–8.0)

## 2023-10-21 LAB — COMPREHENSIVE METABOLIC PANEL
ALT: 18 U/L (ref 0–44)
AST: 46 U/L — ABNORMAL HIGH (ref 15–41)
Albumin: 3.8 g/dL (ref 3.5–5.0)
Alkaline Phosphatase: 75 U/L (ref 38–126)
Anion gap: 11 (ref 5–15)
BUN: 11 mg/dL (ref 6–20)
CO2: 18 mmol/L — ABNORMAL LOW (ref 22–32)
Calcium: 9.3 mg/dL (ref 8.9–10.3)
Chloride: 107 mmol/L (ref 98–111)
Creatinine, Ser: 0.91 mg/dL (ref 0.44–1.00)
GFR, Estimated: >60 mL/min (ref >60)
Glucose, Bld: 97 mg/dL (ref 70–99)
Potassium: 4.9 mmol/L (ref 3.5–5.1)
Sodium: 136 mmol/L (ref 135–145)
Total Bilirubin: 2 mg/dL — ABNORMAL HIGH (ref 0.0–1.2)
Total Protein: 7.6 g/dL (ref 6.5–8.1)

## 2023-10-21 LAB — HCG, QUANTITATIVE, PREGNANCY: hCG, Beta Chain, Quant, S: 1 m[IU]/mL (ref <5)

## 2023-10-21 LAB — GROUP A STREP BY PCR: Group A Strep by PCR: NOT DETECTED

## 2023-10-21 LAB — RAPID URINE DRUG SCREEN, HOSP PERFORMED
Amphetamines: NOT DETECTED
Barbiturates: NOT DETECTED
Benzodiazepines: NOT DETECTED
Cocaine: NOT DETECTED
Opiates: NOT DETECTED
Tetrahydrocannabinol: POSITIVE — AB

## 2023-10-21 LAB — RESP PANEL BY RT-PCR (RSV, FLU A&B, COVID)  RVPGX2
Influenza A by PCR: NEGATIVE
Influenza B by PCR: NEGATIVE
Resp Syncytial Virus by PCR: NEGATIVE
SARS Coronavirus 2 by RT PCR: NEGATIVE

## 2023-10-21 LAB — LIPASE, BLOOD: Lipase: 30 U/L (ref 11–51)

## 2023-10-21 LAB — I-STAT CG4 LACTIC ACID, ED: Lactic Acid, Venous: 1.1 mmol/L (ref 0.5–1.9)

## 2023-10-21 LAB — MONONUCLEOSIS SCREEN: Mono Screen: NEGATIVE

## 2023-10-21 MED ORDER — KETOROLAC TROMETHAMINE 30 MG/ML IJ SOLN
30.0000 mg | Freq: Once | INTRAMUSCULAR | Status: AC
  Administered 2023-10-21: 30 mg via INTRAVENOUS
  Filled 2023-10-21: qty 1

## 2023-10-21 MED ORDER — METOCLOPRAMIDE HCL 5 MG/ML IJ SOLN: 10.0000 mg | Freq: Once | INTRAMUSCULAR | Status: DC

## 2023-10-21 MED ORDER — LACTATED RINGERS IV BOLUS
2000.0000 mL | Freq: Once | INTRAVENOUS | Status: AC
  Administered 2023-10-21: 2000 mL via INTRAVENOUS

## 2023-10-21 MED ORDER — PENICILLIN G BENZATHINE 1200000 UNIT/2ML IM SUSY
1.2000 10*6.[IU] | PREFILLED_SYRINGE | Freq: Once | INTRAMUSCULAR | Status: AC
  Administered 2023-10-21: 1.2 10*6.[IU] via INTRAMUSCULAR
  Filled 2023-10-21: qty 2

## 2023-10-21 MED ORDER — SODIUM CHLORIDE 0.9 % IV SOLN: 25.0000 mg | Freq: Four times a day (QID) | INTRAVENOUS | Status: DC | PRN

## 2023-10-21 MED ORDER — ACETAMINOPHEN 325 MG PO TABS
650.0000 mg | ORAL_TABLET | Freq: Once | ORAL | Status: AC
  Administered 2023-10-21: 650 mg via ORAL
  Filled 2023-10-21: qty 2

## 2023-10-21 MED ORDER — IOHEXOL 300 MG/ML  SOLN
100.0000 mL | Freq: Once | INTRAMUSCULAR | Status: AC | PRN
  Administered 2023-10-21: 100 mL via INTRAVENOUS

## 2023-10-21 MED ORDER — PENICILLIN G BENZATHINE 1200000 UNIT/2ML IM SUSY: 1.2000 10*6.[IU] | PREFILLED_SYRINGE | Freq: Once | INTRAMUSCULAR | Status: DC

## 2023-10-21 MED ORDER — FAMOTIDINE IN NACL 20-0.9 MG/50ML-% IV SOLN
20.0000 mg | Freq: Once | INTRAVENOUS | Status: AC
  Administered 2023-10-21: 20 mg via INTRAVENOUS
  Filled 2023-10-21: qty 50

## 2023-10-21 MED ORDER — HYDROCODONE-ACETAMINOPHEN 7.5-325 MG/15ML PO SOLN: ORAL | 0 refills | Status: DC

## 2023-10-21 MED ORDER — ONDANSETRON HCL 4 MG/2ML IJ SOLN
4.0000 mg | Freq: Once | INTRAMUSCULAR | Status: AC
  Administered 2023-10-21: 4 mg via INTRAVENOUS
  Filled 2023-10-21: qty 2

## 2023-10-21 MED ORDER — DEXAMETHASONE SODIUM PHOSPHATE 10 MG/ML IJ SOLN
10.0000 mg | Freq: Once | INTRAMUSCULAR | Status: AC
  Administered 2023-10-21: 10 mg via INTRAVENOUS
  Filled 2023-10-21: qty 1

## 2023-10-21 NOTE — ED Provider Notes (Signed)
 [place] EMERGENCY DEPARTMENT AT [facility] Provider Note   CSN: [id number] Arrival date & time: 10/21/23  1330     History {Add pertinent medical, surgical, social history, OB history to HPI:1} Chief Complaint  Patient presents with   Sore Throat   Fever   Generalized Body Aches    Savannah [name] is a 38 y.o. female.  HPI    38 year old female comes in with chief complaint of sore throat, fever, body aches with associated nausea and vomiting.  Patient started getting sick yesterday.  She indicates that she has had at least 5 episodes of emesis today, they are yellow/green in nature.  She has a sore throat, body aches but has no cough.  She is having lower quadrant abdominal pain, no burning with urination or blood in the urine.  She does not think she is pregnant.  She denies any sick contacts.  Review of system is positive for dizziness.  Home Medications Prior to Admission medications   Medication Sig Start Date End Date Taking? Authorizing Provider  albuterol (VENTOLIN HFA) 108 (90 Base) MCG/ACT inhaler Inhale 2 puffs into the lungs every 6 (six) hours as needed for wheezing or shortness of breath. Patient not taking: No sig reported 11/18/19   Clapacs, [name], MD  benztropine (COGENTIN) 1 MG tablet Take 1 tablet (1 mg total) by mouth 2 (two) times daily. Patient not taking: No sig reported 11/18/19   Clapacs, [name], MD  haloperidol (HALDOL) 10 MG tablet Take 1 tablet (10 mg total) by mouth at bedtime. Patient not taking: No sig reported 11/18/19   Clapacs, [name], MD  ondansetron (ZOFRAN) 4 MG tablet Take 1 tablet (4 mg total) by mouth every 8 (eight) hours as needed for nausea or vomiting. 12/13/20   Tanda Rockers, PA-C  traZODone (DESYREL) 50 MG tablet Take 1 tablet (50 mg total) by mouth at bedtime as needed for sleep. Patient not taking: No sig reported 11/18/19   Clapacs, [name], MD      Allergies    Sudafed [pseudoephedrine]    Review of Systems    Review of Systems  All other systems reviewed and are negative.   Physical Exam Updated Vital Signs BP 122/70   Pulse (!) 101   Temp 99.9 F (37.7 C)   Resp 19   Ht 5\' 2"  (1.575 m)   Wt (!) 142.9 kg   SpO2 98%   BMI 57.62 kg/m  Physical Exam Vitals and nursing note reviewed.  Constitutional:      Appearance: She is well-developed. She is ill-appearing.  HENT:     Head: Normocephalic and atraumatic.     Mouth/Throat:     Pharynx: Uvula midline. Posterior oropharyngeal erythema present.     Tonsils: Tonsillar exudate and tonsillar abscess present.     Comments: Bilateral tonsillar enlargement with exudates Eyes:     Extraocular Movements: Extraocular movements intact.  Cardiovascular:     Rate and Rhythm: Normal rate.  Pulmonary:     Effort: Pulmonary effort is normal.     Breath sounds: No stridor.  Musculoskeletal:     Cervical back: Normal range of motion and neck supple.  Skin:    General: Skin is dry.  Neurological:     Mental Status: She is alert and oriented to person, place, and time.     ED Results / Procedures / Treatments   Labs (all labs ordered are listed, but only abnormal results are displayed) Labs Reviewed  COMPREHENSIVE METABOLIC PANEL - Abnormal; Notable for the following components:      Result Value   CO2 18 (*)    AST 46 (*)    Total Bilirubin 2.0 (*)    All other components within normal limits  CBC WITH DIFFERENTIAL/PLATELET - Abnormal; Notable for the following components:   WBC 20.3 (*)    RBC 5.50 (*)    HCT 46.3 (*)    Neutro Abs 18.1 (*)    Abs Immature Granulocytes 0.15 (*)    All other components within normal limits  RESP PANEL BY RT-PCR (RSV, FLU A&B, COVID)  [id number]  GROUP A STREP BY PCR  HCG, QUANTITATIVE, PREGNANCY    EKG None  Radiology DG Chest Port 1 View Result Date: 10/21/2023 CLINICAL DATA:  Sore throat and body aches.  Fever this morning. EXAM: PORTABLE CHEST 1 VIEW COMPARISON:  05/09/2012. FINDINGS:  Cardiac silhouette is normal in size and configuration. Normal mediastinal and hilar contours. Clear lungs.  No pleural effusion or pneumothorax. Skeletal structures are grossly intact. IMPRESSION: No active disease. Electronically Signed   By: Amie Portland M.D.   On: 10/21/2023 14:43    Procedures Procedures  {Document cardiac monitor, telemetry assessment procedure when appropriate:1}  Medications Ordered in ED Medications  promethazine (PHENERGAN) 25 mg in sodium chloride 0.9 % 50 mL IVPB (has no administration in time range)  acetaminophen (TYLENOL) tablet 650 mg (650 mg Oral Given 10/21/23 1426)    ED Course/ Medical Decision Making/ A&P   {   Click here for ABCD2, HEART and other calculatorsREFRESH Note before signing :1}                              Medical Decision Making Amount and/or Complexity of Data Reviewed Labs: ordered. Radiology: ordered.  Risk OTC drugs.   38 year old patient comes in with chief complaint of sore throat, body aches, subjective fevers, chills, nausea and vomiting.  Patient has previous history of psychosis, bipolar disorder and agitation.  She does not have any concerning medical history.  On exam, patient appears to be acutely ill because of her nausea and vomiting. She has mild abdominal tenderness in the lower quadrant, no vaginal discharge or bleeding, no UTI-like symptoms.  Her primary issue is sore throat and fevers along with nausea and vomiting.  Differential diagnosis for this patient includes tonsillitis, strep pharyngitis, flu/COVID, electrolyte abnormality, AKI, dehydration, small bowel obstruction, acute cystitis.  Pregnancy test ordered to rule out ectopic as a possibility.  P.o. challenge initiated.  Reassessment: Patient's initial workup indicates white count of 20.3.  She also has low bicarb of 18, total bili of 2.0.  Her flu test is negative.  Pregnancy test is negative.  Strep test is negative.  I reassessed the patient.   She still has tonsillar enlargement and exudates.  I will give her penicillin IM, given high clinical suspicion for tonsillitis.  CT abdomen and pelvis ordered. UA also ordered and pending.  Incoming team to follow-up on CT and UA.  If patient's workup is negative, then she will still benefit with treatment for tonsillitis as an outpatient. Patient will need to pass oral challenge before she can be discharged.  Final Clinical Impression(s) / ED Diagnoses Final diagnoses:  None    Rx / DC Orders ED Discharge Orders     None

## 2023-10-21 NOTE — ED Triage Notes (Signed)
 Pt reports with sore throat, body aches. And fever since this morning.

## 2023-10-21 NOTE — ED Provider Notes (Signed)
 Sore throat and abdo pain. F/U strep, if positive treat. Need CT abdo if strep negative Physical Exam  BP 122/70   Pulse (!) 101   Temp 99.9 F (37.7 C)   Resp 19   Ht 5\' 2"  (1.575 m)   Wt (!) 142.9 kg   SpO2 98%   BMI 57.62 kg/m   Physical Exam  Procedures  Procedures  ED Course / MDM    Medical Decision Making Amount and/or Complexity of Data Reviewed Labs: ordered. Radiology: ordered.  Risk OTC drugs. Prescription drug management.   Upon recheck, patient was having significant amount of vomiting.  She continued to be very uncomfortable with abdominal pain and sore throat.  At this time we will add Zofran, Pepcid and rehydration with 2 L lactated Ringer's. The patient does have significant tonsillar enlargement and erythema.  Will also add Monospot and lipase for symptoms.  Rapid strep and Monospot negative.  Will proceed with Bicillin LA IM.  Upon recheck patient's symptoms of nausea and vomiting had improved significantly.  She continued to have a lot of sore throat.  Will additionally treat with Decadron and Toradol.  After Decadron and Toradol patient is now feeling improved and ready to try oral intake.  At this time with CT abdomen pelvis reviewed and no acute findings, mono and strep negative, will proceed with treatment as discussed for pharyngitis with Dr. Rhunette Croft.  I did ask the patient about any possibility of STI due to oral sex practices.  She advises she does not perform oral sex and her husband's been incarcerated since January so, no risk for gonococcal pharyngitis.       Arby Barrette, MD 10/21/23 [phone]

## 2023-10-21 NOTE — Discharge Instructions (Addendum)
 1.  You may take the pain medication in syrup form every 6 hours for pain as needed.  This contains a narcotic pain medication and is the equivalent of 1 regular strength acetaminophen tablet 325 mg.  If your pain is not improving you can take acetaminophen (Tylenol) every 6 hours for pain control.  You may take it in a tablet or liquid form bought at the pharmacy.  You may use the Tylenol first and see if your pain is adequately controlled, if you need additional pain control you may add a 15 mL dose of the liquid pain medication Hycet.  650 mg of regular strength acetaminophen (Tylenol) +1 dose of Hycet with 325 mg of acetaminophen (Tylenol) equals the same dose as extra strength Tylenol every 6 hours. 2.  You were given a steroid called Decadron in the emergency department, this should help with throat swelling and pain over the next 12 to 24 hours. 3.  Return to the emergency department immediately if you are having any difficulty breathing or swallowing. 4.  If needed you may take Zofran for nausea.  This dissolves under the tongue. 5.  You are given a penicillin shot in the emergency department.  You do not need additional antibiotics at this time.  You should have a follow-up check with your family doctor within 3 to 5 days.  You need to repeat check on your labs to make sure that your liver function tests are back to normal and your white blood cell count is improving after treatment.  Return to the emergency department if you have worsening or concerning symptoms.

## 2023-10-22 ENCOUNTER — Telehealth (HOSPITAL_COMMUNITY): Payer: Self-pay | Admitting: Emergency Medicine

## 2023-10-22 MED ORDER — HYDROCODONE-ACETAMINOPHEN 7.5-325 MG/15ML PO SOLN: ORAL | 0 refills | Status: DC

## 2023-10-22 MED ORDER — HYDROCODONE-ACETAMINOPHEN 7.5-325 MG/15ML PO SOLN: ORAL | 0 refills | Status: AC

## 2023-10-22 NOTE — Telephone Encounter (Cosign Needed)
 Patient states that pharmacy does not have her medication.  She confirmed that [facility] has this medication.  I did advise charge nurse to convey to the patient that she needs to confirm which pharmacy has this medication in stock prior to [place] sending this in.  We cannot continue to randomly sent to different pharmacies.

## 2023-10-22 NOTE — Telephone Encounter (Cosign Needed)
 Charge nurse requesting medication be sent to a different pharmacy.

## 2023-10-22 NOTE — Telephone Encounter (Cosign Needed)
 Patient returns today stating that she did not get her Hycet prescription.  According to AVS this was printed.  No printed prescription attached to the AVS.  No record of it being e-prescribed.  No reason mentioned in the previous note as to why it would have been printed.  Will resend this.

## 2023-10-23 ENCOUNTER — Emergency Department (HOSPITAL_COMMUNITY)

## 2023-10-23 ENCOUNTER — Encounter (HOSPITAL_COMMUNITY): Payer: Self-pay | Admitting: Emergency Medicine

## 2023-10-23 ENCOUNTER — Other Ambulatory Visit: Payer: Self-pay

## 2023-10-23 ENCOUNTER — Emergency Department (HOSPITAL_COMMUNITY)
Admission: EM | Admit: 2023-10-23 | Discharge: 2023-10-24 | Disposition: A | Discharge status: Home / Self Care | Attending: Emergency Medicine | Admitting: Emergency Medicine

## 2023-10-23 DIAGNOSIS — J029 Acute pharyngitis, unspecified: Secondary | ICD-10-CM | POA: Diagnosis present

## 2023-10-23 DIAGNOSIS — R590 Localized enlarged lymph nodes: Secondary | ICD-10-CM | POA: Insufficient documentation

## 2023-10-23 DIAGNOSIS — J039 Acute tonsillitis, unspecified: Secondary | ICD-10-CM | POA: Insufficient documentation

## 2023-10-23 DIAGNOSIS — R911 Solitary pulmonary nodule: Secondary | ICD-10-CM | POA: Diagnosis not present

## 2023-10-23 DIAGNOSIS — D72829 Elevated white blood cell count, unspecified: Secondary | ICD-10-CM | POA: Diagnosis not present

## 2023-10-23 DIAGNOSIS — R1310 Dysphagia, unspecified: Secondary | ICD-10-CM | POA: Diagnosis not present

## 2023-10-23 LAB — CBC WITH DIFFERENTIAL/PLATELET
Abs Immature Granulocytes: 0.25 10*3/uL — ABNORMAL HIGH (ref 0.00–0.07)
Basophils Absolute: 0.1 10*3/uL (ref 0.0–0.1)
Basophils Relative: 0 %
Eosinophils Absolute: 0.1 10*3/uL (ref 0.0–0.5)
Eosinophils Relative: 0 %
HCT: 41.8 % (ref 36.0–46.0)
Hemoglobin: 13.3 g/dL (ref 12.0–15.0)
Immature Granulocytes: 1 %
Lymphocytes Relative: 12 %
Lymphs Abs: 2.4 10*3/uL (ref 0.7–4.0)
MCH: 27.5 pg (ref 26.0–34.0)
MCHC: 31.8 g/dL (ref 30.0–36.0)
MCV: 86.4 fL (ref 80.0–100.0)
Monocytes Absolute: 1.7 10*3/uL — ABNORMAL HIGH (ref 0.1–1.0)
Monocytes Relative: 8 %
Neutro Abs: 15.3 10*3/uL — ABNORMAL HIGH (ref 1.7–7.7)
Neutrophils Relative %: 79 %
Platelets: 273 10*3/uL (ref 150–400)
RBC: 4.84 MIL/uL (ref 3.87–5.11)
RDW: 16.1 % — ABNORMAL HIGH (ref 11.5–15.5)
WBC: 19.7 10*3/uL — ABNORMAL HIGH (ref 4.0–10.5)
nRBC: 0 % (ref 0.0–0.2)

## 2023-10-23 LAB — HCG, QUANTITATIVE, PREGNANCY: hCG, Beta Chain, Quant, S: 1 m[IU]/mL (ref <5)

## 2023-10-23 LAB — COMPREHENSIVE METABOLIC PANEL
ALT: 15 U/L (ref 0–44)
AST: 23 U/L (ref 15–41)
Albumin: 3.3 g/dL — ABNORMAL LOW (ref 3.5–5.0)
Alkaline Phosphatase: 73 U/L (ref 38–126)
Anion gap: 7 (ref 5–15)
BUN: 9 mg/dL (ref 6–20)
CO2: 26 mmol/L (ref 22–32)
Calcium: 8.5 mg/dL — ABNORMAL LOW (ref 8.9–10.3)
Chloride: 104 mmol/L (ref 98–111)
Creatinine, Ser: 0.76 mg/dL (ref 0.44–1.00)
GFR, Estimated: >60 mL/min (ref >60)
Glucose, Bld: 104 mg/dL — ABNORMAL HIGH (ref 70–99)
Potassium: 3.7 mmol/L (ref 3.5–5.1)
Sodium: 137 mmol/L (ref 135–145)
Total Bilirubin: 0.8 mg/dL (ref 0.0–1.2)
Total Protein: 7.2 g/dL (ref 6.5–8.1)

## 2023-10-23 LAB — GROUP A STREP BY PCR: Group A Strep by PCR: NOT DETECTED

## 2023-10-23 LAB — PREGNANCY, URINE: Preg Test, Ur: NEGATIVE

## 2023-10-23 LAB — MONONUCLEOSIS SCREEN: Mono Screen: NEGATIVE

## 2023-10-23 MED ORDER — KETOROLAC TROMETHAMINE 15 MG/ML IJ SOLN
15.0000 mg | Freq: Once | INTRAMUSCULAR | Status: AC
Start: 2023-10-23 — End: 2023-10-23
  Administered 2023-10-23: 15 mg via INTRAVENOUS
  Filled 2023-10-23: qty 1

## 2023-10-23 MED ORDER — AMOXICILLIN-POT CLAVULANATE 600-42.9 MG/5ML PO SUSR
875.0000 mg | Freq: Once | ORAL | Status: AC
  Administered 2023-10-23: 875 mg via ORAL
  Filled 2023-10-23: qty 7.3

## 2023-10-23 MED ORDER — SODIUM CHLORIDE (PF) 0.9 % IJ SOLN
INTRAMUSCULAR | Status: AC
Start: 2023-10-23 — End: ?
  Filled 2023-10-23: qty 50

## 2023-10-23 MED ORDER — IOHEXOL 300 MG/ML  SOLN
75.0000 mL | Freq: Once | INTRAMUSCULAR | Status: AC | PRN
  Administered 2023-10-23: 75 mL via INTRAVENOUS

## 2023-10-23 MED ORDER — LIDOCAINE VISCOUS HCL 2 % MT SOLN
15.0000 mL | Freq: Once | OROMUCOSAL | Status: AC
  Administered 2023-10-23: 15 mL via OROMUCOSAL
  Filled 2023-10-23: qty 15

## 2023-10-23 MED ORDER — DEXAMETHASONE SODIUM PHOSPHATE 10 MG/ML IJ SOLN
10.0000 mg | Freq: Once | INTRAMUSCULAR | Status: DC
  Filled 2023-10-23: qty 1

## 2023-10-23 MED ORDER — DEXAMETHASONE SODIUM PHOSPHATE 10 MG/ML IJ SOLN
10.0000 mg | Freq: Once | INTRAMUSCULAR | Status: AC
  Administered 2023-10-23: 10 mg via INTRAVENOUS

## 2023-10-23 NOTE — ED Provider Notes (Signed)
 [place] EMERGENCY DEPARTMENT AT [facility] Provider Note   CSN: [id number] Arrival date & time: 10/23/23  1934     History  Chief Complaint  Patient presents with   Sore Throat    Savannah [name] is a 38 y.o. female, no pertinent past medical history, who presents to the ED secondary to a sore throat, this been going on for the last 3 days.  She states for the last 3 days, her throat has been more swollen, she went to the ER, couple days ago, and was given a antibiotic shot, but states her symptoms are getting worse.  Her throat feels more swollen, and she has to breathe with her mouth open, to make sure she can get air.  She has difficulty swallowing, has had reduced p.o. intake secondary to this.  She denies any kind of fevers, chills, has not had any further nausea, vomiting.  Reports that the right ear is kind of tender, but denies any kind of hearing loss.  Reports some nasal congestion    Home Medications Prior to Admission medications   Medication Sig Start Date End Date Taking? Authorizing Provider  albuterol (VENTOLIN HFA) 108 (90 Base) MCG/ACT inhaler Inhale 2 puffs into the lungs every 6 (six) hours as needed for wheezing or shortness of breath. Patient not taking: Reported on 12/13/2020 11/18/19   Clapacs, [name], MD  benztropine (COGENTIN) 1 MG tablet Take 1 tablet (1 mg total) by mouth 2 (two) times daily. Patient not taking: Reported on 12/13/2020 11/18/19   Clapacs, [name], MD  haloperidol (HALDOL) 10 MG tablet Take 1 tablet (10 mg total) by mouth at bedtime. Patient not taking: Reported on 12/13/2020 11/18/19   Clapacs, [name], MD  HYDROcodone-acetaminophen (HYCET) 7.5-325 mg/15 ml solution 15 mL every 6 hours as needed for pain 10/22/23   Karie Mainland, Amjad, PA-C  ondansetron (ZOFRAN) 4 MG tablet Take 1 tablet (4 mg total) by mouth every 8 (eight) hours as needed for nausea or vomiting. Patient not taking: Reported on 10/21/2023 12/13/20   Tanda Rockers, PA-C  traZODone  (DESYREL) 50 MG tablet Take 1 tablet (50 mg total) by mouth at bedtime as needed for sleep. Patient not taking: Reported on 12/13/2020 11/18/19   Clapacs, [name], MD      Allergies    Sudafed [pseudoephedrine]    Review of Systems   Review of Systems  Physical Exam Updated Vital Signs BP (!) 140/99 (BP Location: Left Arm)   Pulse (!) 102   Temp 98.6 F (37 C) (Oral)   Resp 20   SpO2 100%  Physical Exam Vitals and nursing note reviewed.  Constitutional:      General: She is not in acute distress.    Appearance: She is well-developed.  HENT:     Head: Normocephalic and atraumatic.     Right Ear: Tympanic membrane normal.     Left Ear: Tympanic membrane normal.     Nose: Congestion present.     Mouth/Throat:     Mouth: Mucous membranes are moist.     Pharynx: Oropharyngeal exudate and posterior oropharyngeal erythema present.     Tonsils: Tonsillar exudate present. 3+ on the right. 3+ on the left.     Comments: Muffled voice Eyes:     Conjunctiva/sclera: Conjunctivae normal.  Cardiovascular:     Rate and Rhythm: Normal rate and regular rhythm.     Heart sounds: No murmur heard. Pulmonary:     Effort: Pulmonary effort is  normal. No respiratory distress.     Breath sounds: Normal breath sounds.  Abdominal:     Palpations: Abdomen is soft.     Tenderness: There is no abdominal tenderness.  Musculoskeletal:        General: No swelling.     Cervical back: Neck supple.  Skin:    General: Skin is warm and dry.     Capillary Refill: Capillary refill takes less than 2 seconds.  Neurological:     Mental Status: She is alert.  Psychiatric:        Mood and Affect: Mood normal.     ED Results / Procedures / Treatments   Labs (all labs ordered are listed, but only abnormal results are displayed) Labs Reviewed  CBC WITH DIFFERENTIAL/PLATELET - Abnormal; Notable for the following components:      Result Value   WBC 19.7 (*)    RDW 16.1 (*)    Neutro Abs 15.3 (*)     Monocytes Absolute 1.7 (*)    Abs Immature Granulocytes 0.25 (*)    All other components within normal limits  COMPREHENSIVE METABOLIC PANEL - Abnormal; Notable for the following components:   Glucose, Bld 104 (*)    Calcium 8.5 (*)    Albumin 3.3 (*)    All other components within normal limits  GROUP A STREP BY PCR  MONONUCLEOSIS SCREEN  PREGNANCY, URINE  HCG, QUANTITATIVE, PREGNANCY  GC/CHLAMYDIA PROBE AMP ([place]) NOT AT [facility]    EKG None  Radiology No results found.  Procedures Procedures    Medications Ordered in ED Medications  lidocaine (XYLOCAINE) 2 % viscous mouth solution 15 mL (15 mLs Mouth/Throat Given 10/23/23 2230)  amoxicillin-clavulanate (AUGMENTIN) 600-42.9 MG/5ML suspension 875 mg (875 mg Oral Given 10/23/23 2235)  dexamethasone (DECADRON) injection 10 mg (10 mg Intravenous Given 10/23/23 2229)  ketorolac (TORADOL) 15 MG/ML injection 15 mg (15 mg Intravenous Given 10/23/23 2237)  iohexol (OMNIPAQUE) 300 MG/ML solution 75 mL (75 mLs Intravenous Contrast Given 10/23/23 2342)    ED Course/ Medical Decision Making/ A&P                                 Medical Decision Making Patient is a 38 year old female, here with severe tonsil hypertrophy, with exudates present.  Symptoms are worsening after receiving penicillin, IM shot, and Decadron.  She states that she is not getting any better.  She has that exudates on exam, her nausea, vomiting has resolved, but she has right ear pain, sore throat, difficulty eating.  Given the symptoms, with white count of 20,000, 2 days prior, we will obtain a CT soft tissue neck, for further evaluation, to check out if there is any evidence any kind of abscess that needs to be drained.  Decadron, Toradol ordered, as well as lidocaine viscus and Augmentin solution  Amount and/or Complexity of Data Reviewed Labs: ordered.    Details: Leukocytosis of 19,000, negative strep test, negative monotest Radiology: ordered. Discussion of  management or test interpretation with external provider(s): Patient states a little bit of improvement with the Toradol, Decadron, still very swollen.  Pending CT soft tissue, she has a white count of 19,000, similar to about a 2 days ago.  Symptoms have not improved with pen IM, GC swab collected.  Signed out to [place], [place], he will follow-up on results of exam. Patient's respirations stable at this time. No tripoding.   Risk Prescription drug management.  Final Clinical Impression(s) / ED Diagnoses Final diagnoses:  None    Rx / DC Orders ED Discharge Orders     None         Mae Denunzio, [place]ley [place], PA 10/23/23 2344    Royanne Foots, DO 10/24/23 1459

## 2023-10-23 NOTE — ED Triage Notes (Signed)
 Patient presents due to increased soreness in her throat. She believes there is increased inflammation. Pain with swallowing has worsened as well.

## 2023-10-24 LAB — GC/CHLAMYDIA PROBE AMP (~~LOC~~) NOT AT ARMC
Chlamydia: NEGATIVE
Comment: NEGATIVE
Comment: NORMAL
Neisseria Gonorrhea: NEGATIVE

## 2023-10-24 MED ORDER — CEPHALEXIN 500 MG PO CAPS: 500.0000 mg | ORAL_CAPSULE | Freq: Two times a day (BID) | ORAL | 0 refills | Status: DC

## 2023-10-24 MED ORDER — PREDNISONE 10 MG PO TABS
20.0000 mg | ORAL_TABLET | Freq: Every day | ORAL | 0 refills | Status: AC
Start: 2023-10-24 — End: 2023-10-31

## 2023-10-24 NOTE — ED Provider Notes (Signed)
  Physical Exam  BP (!) 140/99 (BP Location: Left Arm)   Pulse (!) 102   Temp 98.6 F (37 C) (Oral)   Resp 20   SpO2 100%   Physical Exam  Procedures  Procedures  ED Course / MDM    Medical Decision Making   Patient care assumed at shift handoff from previous provider.  Please see her note for full details.  In short, patient is a 38 year old female with no relevant past medical history complaining of sore throat which began on Saturday.  She was seen in the ER on Saturday was diagnosed with likely tonsillitis, given a one-time dose of penicillin and discharge.  She states her symptoms have not improved.  At time of shift handoff plan to follow-up on CT soft tissue neck to evaluate for possible PTA or other acute abnormality.  CT results:  1. Findings consistent with acute tonsillitis/pharyngitis. No  discrete tonsillar or peritonsillar abscess.  2. Small layering retropharyngeal effusion, likely reactive. No  loculated retropharyngeal abscess.  3. Prominent upper cervical lymph nodes, presumably reactive.  4. 7 mm right upper lobe pulmonary nodule, indeterminate   No signs of PTA or tonsillar abscess.  Presentation consistent with severe tonsillitis.  Plan to discharge home with prescription for Keflex and recommendation for ENT follow-up. Patient also prescribed short course of prednisone  Patient informed of incidental findings on CT and recommendations for follow-up with her primary care provider.  Patient given return precautions.  Stable for discharge home at this time.   Pamala Duffel 10/24/23 0203    Nira Conn, MD 10/24/23 [phone]

## 2023-10-24 NOTE — Discharge Instructions (Addendum)
 Your workup tonight was consistent with tonsillitis.  I have prescribed an antibiotic to be taken as directed until complete. I also prescribed a course of steroids.  You should continue to take ibuprofen at home for pain control.  Schedule a follow-up appointment as needed with otolaryngology.  Contact information is listed. The CT results summary as below.  There were incidental findings of a 7 mm right upper lobe pulmonary nodule.  This seems to be unrelated to your current symptoms but should be discussed with your primary care provider for possible nonemergent further imaging in the future  1. Findings consistent with acute tonsillitis/pharyngitis. No  discrete tonsillar or peritonsillar abscess.  2. Small layering retropharyngeal effusion, likely reactive. No  loculated retropharyngeal abscess.  3. Prominent upper cervical lymph nodes, presumably reactive.  4. 7 mm right upper lobe pulmonary nodule, indeterminate. Per  Fleischner Society Guidelines, recommend a non-contrast Chest CT at  6-12 months. If patient is high risk for malignancy, consider an  additional non-contrast Chest CT at 18-24 months. If patient is low  risk for malignancy, non-contrast Chest CT at 18-24 months is  optional. These guidelines do not apply to immunocompromised  patients and patients with cancer. Follow up in patients with  significant comorbidities as clinically warranted. For lung cancer  screening, adhere to Lung-RADS guidelines.

## 2023-10-30 ENCOUNTER — Emergency Department (HOSPITAL_COMMUNITY)
Admission: EM | Admit: 2023-10-30 | Discharge: 2023-10-30 | Disposition: A | Discharge status: Home / Self Care | Attending: Emergency Medicine | Admitting: Emergency Medicine

## 2023-10-30 DIAGNOSIS — T7840XA Allergy, unspecified, initial encounter: Secondary | ICD-10-CM | POA: Diagnosis not present

## 2023-10-30 DIAGNOSIS — R21 Rash and other nonspecific skin eruption: Secondary | ICD-10-CM | POA: Diagnosis present

## 2023-10-30 LAB — CBG MONITORING, ED: Glucose-Capillary: 93 mg/dL (ref 70–99)

## 2023-10-30 LAB — URINALYSIS, W/ REFLEX TO CULTURE (INFECTION SUSPECTED)
Bilirubin Urine: NEGATIVE
Glucose, UA: NEGATIVE mg/dL
Hgb urine dipstick: NEGATIVE
Ketones, ur: NEGATIVE mg/dL
Leukocytes,Ua: NEGATIVE
Nitrite: NEGATIVE
Protein, ur: NEGATIVE mg/dL
Specific Gravity, Urine: 1.012 (ref 1.005–1.030)
pH: 5 (ref 5.0–8.0)

## 2023-10-30 MED ORDER — ERYTHROMYCIN BASE 250 MG PO TABS: 250.0000 mg | ORAL_TABLET | Freq: Four times a day (QID) | ORAL | 0 refills | Status: AC

## 2023-10-30 NOTE — ED Provider Notes (Addendum)
 [place] EMERGENCY DEPARTMENT AT [facility] Provider Note   CSN: [id number] Arrival date & time: 10/30/23  0957     History  Chief Complaint  Patient presents with   Rash    Savannah [name] is a 38 y.o. female.  38 year old female presents with rash to her arms and torso.  Patient endorses pruritus patient states began after she started taking amoxicillin and prednisone for tonsillitis.  She also notes 24 hours of polyuria.  Denies any polydipsia.  No prior history of diabetes.  Denies any dysuria fever or chills.  Notes that her sore throat is getting better.  Has been using Benadryl with limited relief.       Home Medications Prior to Admission medications   Medication Sig Start Date End Date Taking? Authorizing Provider  albuterol (VENTOLIN HFA) 108 (90 Base) MCG/ACT inhaler Inhale 2 puffs into the lungs every 6 (six) hours as needed for wheezing or shortness of breath. Patient not taking: Reported on 12/13/2020 11/18/19   Clapacs, [name], MD  benztropine (COGENTIN) 1 MG tablet Take 1 tablet (1 mg total) by mouth 2 (two) times daily. Patient not taking: Reported on 12/13/2020 11/18/19   Clapacs, [name], MD  cephALEXin (KEFLEX) 500 MG capsule Take 1 capsule (500 mg total) by mouth 2 (two) times daily for 10 days. 10/24/23 11/03/23  Darrick Grinder, PA-C  haloperidol (HALDOL) 10 MG tablet Take 1 tablet (10 mg total) by mouth at bedtime. Patient not taking: Reported on 12/13/2020 11/18/19   Clapacs, [name], MD  HYDROcodone-acetaminophen (HYCET) 7.5-325 mg/15 ml solution 15 mL every 6 hours as needed for pain 10/22/23   Karie Mainland, Amjad, PA-C  ondansetron (ZOFRAN) 4 MG tablet Take 1 tablet (4 mg total) by mouth every 8 (eight) hours as needed for nausea or vomiting. Patient not taking: Reported on 10/21/2023 12/13/20   Tanda Rockers, PA-C  predniSONE (DELTASONE) 10 MG tablet Take 2 tablets (20 mg total) by mouth daily for 7 days. 10/24/23 10/31/23  Darrick Grinder, PA-C  traZODone  (DESYREL) 50 MG tablet Take 1 tablet (50 mg total) by mouth at bedtime as needed for sleep. Patient not taking: Reported on 12/13/2020 11/18/19   Clapacs, [name], MD      Allergies    Sudafed [pseudoephedrine]    Review of Systems   Review of Systems  All other systems reviewed and are negative.   Physical Exam Updated Vital Signs BP (!) 129/101 (BP Location: Right Arm)   Pulse (!) 102   Temp 98.3 F (36.8 C) (Oral)   Resp 20   SpO2 100%  Physical Exam Vitals and nursing note reviewed.  Constitutional:      General: She is not in acute distress.    Appearance: Normal appearance. She is well-developed. She is not toxic-appearing.  HENT:     Head: Normocephalic and atraumatic.     Mouth/Throat:     Comments: No oral mucosal involvement Eyes:     General: Lids are normal.     Conjunctiva/sclera: Conjunctivae normal.     Pupils: Pupils are equal, round, and reactive to light.  Neck:     Thyroid: No thyroid mass.     Trachea: No tracheal deviation.  Cardiovascular:     Rate and Rhythm: Normal rate and regular rhythm.     Heart sounds: Normal heart sounds. No murmur heard.    No gallop.  Pulmonary:     Effort: Pulmonary effort is normal. No respiratory distress.  Breath sounds: Normal breath sounds. No stridor. No decreased breath sounds, wheezing, rhonchi or rales.  Abdominal:     General: There is no distension.     Palpations: Abdomen is soft.     Tenderness: There is no abdominal tenderness. There is no rebound.  Musculoskeletal:        General: No tenderness. Normal range of motion.     Cervical back: Normal range of motion and neck supple.  Skin:    General: Skin is warm and dry.     Findings: Rash present. No abrasion. Rash is papular.     Comments: Papular rash noted to bilateral lower extremities as well as upper extremities.  Neurological:     Mental Status: She is alert and oriented to person, place, and time. Mental status is at baseline.     GCS: GCS eye  subscore is 4. GCS verbal subscore is 5. GCS motor subscore is 6.     Cranial Nerves: No cranial nerve deficit.     Sensory: No sensory deficit.     Motor: Motor function is intact.  Psychiatric:        Attention and Perception: Attention normal.        Speech: Speech normal.        Behavior: Behavior normal.     ED Results / Procedures / Treatments   Labs (all labs ordered are listed, but only abnormal results are displayed) Labs Reviewed - No data to display  EKG None  Radiology No results found.  Procedures Procedures    Medications Ordered in ED Medications - No data to display  ED Course/ Medical Decision Making/ A&P                                 Medical Decision Making Risk Prescription drug management.   Patient's rash this time does not appear anaphylactic.  It now involves her hands and likely has coxsackievirus.  She did complain of some polyuria and her urinalysis was negative for infection and her CBG was normal.  Instructed to stop taking amoxicillin and to use Benadryl.  Return precautions given        Final Clinical Impression(s) / ED Diagnoses Final diagnoses:  None    Rx / DC Orders ED Discharge Orders     None         Lorre Nick, MD 10/30/23 1218    Lorre Nick, MD 10/30/23 1220

## 2023-10-30 NOTE — Discharge Instructions (Addendum)
 Your rash could be caused by a virus or it could be caused by allergic reaction.  Stop taking the antibiotic that you are prescribed and take the new one to be on the safe side.  Use Benadryl as directed for any itching.

## 2023-10-30 NOTE — ED Triage Notes (Signed)
 Pt states that she was recently seen for tonsillitis and prescribed abx and prednisone. She states that on Saturday she began having pruritic rash to her legs, which has now spread to her torso, arms, and hands. Pt denies swelling or trouble breathing. NAD noted during triage.

## 2023-11-14 ENCOUNTER — Emergency Department (HOSPITAL_COMMUNITY)
Admission: EM | Admit: 2023-11-14 | Discharge: 2023-11-14 | Disposition: A | Discharge status: Home / Self Care | Attending: Emergency Medicine | Admitting: Emergency Medicine

## 2023-11-14 ENCOUNTER — Other Ambulatory Visit: Payer: Self-pay

## 2023-11-14 DIAGNOSIS — R21 Rash and other nonspecific skin eruption: Secondary | ICD-10-CM | POA: Diagnosis present

## 2023-11-14 LAB — URINALYSIS, ROUTINE W REFLEX MICROSCOPIC
Bilirubin Urine: NEGATIVE
Glucose, UA: NEGATIVE mg/dL
Hgb urine dipstick: NEGATIVE
Ketones, ur: NEGATIVE mg/dL
Leukocytes,Ua: NEGATIVE
Nitrite: NEGATIVE
Protein, ur: NEGATIVE mg/dL
Specific Gravity, Urine: 1.014 (ref 1.005–1.030)
pH: 6 (ref 5.0–8.0)

## 2023-11-14 LAB — HIV ANTIBODY (ROUTINE TESTING W REFLEX): HIV Screen 4th Generation wRfx: NONREACTIVE

## 2023-11-14 LAB — PREGNANCY, URINE: Preg Test, Ur: NEGATIVE

## 2023-11-14 MED ORDER — PENICILLIN G BENZATHINE 1200000 UNIT/2ML IM SUSY
2.4000 10*6.[IU] | PREFILLED_SYRINGE | Freq: Once | INTRAMUSCULAR | Status: AC
  Administered 2023-11-14: 2.4 10*6.[IU] via INTRAMUSCULAR
  Filled 2023-11-14: qty 4

## 2023-11-14 NOTE — ED Provider Notes (Signed)
 [place] EMERGENCY DEPARTMENT AT [facility] Provider Note   CSN: [id number] Arrival date & time: 11/14/23  1045     History  Chief Complaint  Patient presents with   Rash    Savannah [name] is a 38 y.o. female.  The history is provided by the patient and medical records. No language interpreter was used.  Rash    38 year old female history of bipolar, schizophrenia presenting with complaints of rash.  Patient states she noticed a painful rash that appears on hands and feet for the past 2 weeks.  Initially it was slightly itchy and now it is more painful.  There are no associated joint pain no fever sore throat or cold symptoms.  She did develop a throat infection several weeks ago and was started on antibiotic as well as prednisone.  She is unsure of it was related to her rash as it appears after that.  She denies any new sexual partner denies any prior STI no complaints of vaginal bleeding or vaginal discharge.  She did change her soap several weeks prior.  Home Medications Prior to Admission medications   Medication Sig Start Date End Date Taking? Authorizing Provider  albuterol (VENTOLIN HFA) 108 (90 Base) MCG/ACT inhaler Inhale 2 puffs into the lungs every 6 (six) hours as needed for wheezing or shortness of breath. Patient not taking: Reported on 12/13/2020 11/18/19   Clapacs, Elida Grounds, MD  benztropine (COGENTIN) 1 MG tablet Take 1 tablet (1 mg total) by mouth 2 (two) times daily. Patient not taking: Reported on 12/13/2020 11/18/19   Clapacs, Elida Grounds, MD  erythromycin (E-MYCIN) 250 MG tablet Take 1 tablet (250 mg total) by mouth every 6 (six) hours. 10/30/23   Lind Repine, MD  haloperidol (HALDOL) 10 MG tablet Take 1 tablet (10 mg total) by mouth at bedtime. Patient not taking: Reported on 12/13/2020 11/18/19   Clapacs, [name], MD  HYDROcodone-acetaminophen (HYCET) 7.5-325 mg/15 ml solution 15 mL every 6 hours as needed for pain 10/22/23   Ceclia Cohens, Amjad, PA-C  ondansetron  (ZOFRAN) 4 MG tablet Take 1 tablet (4 mg total) by mouth every 8 (eight) hours as needed for nausea or vomiting. Patient not taking: Reported on 10/21/2023 12/13/20   Venter, Margaux, PA-C  traZODone (DESYREL) 50 MG tablet Take 1 tablet (50 mg total) by mouth at bedtime as needed for sleep. Patient not taking: Reported on 12/13/2020 11/18/19   Clapacs, Elida Grounds, MD      Allergies    Sudafed [pseudoephedrine]    Review of Systems   Review of Systems  Skin:  Positive for rash.  All other systems reviewed and are negative.   Physical Exam Updated Vital Signs BP 119/84 (BP Location: Left Arm)   Pulse 90   Temp 97.8 F (36.6 C) (Oral)   Resp 17   Ht 5\' 2"  (1.575 m)   Wt (!) 142 kg   SpO2 100%   BMI 57.26 kg/m  Physical Exam Vitals and nursing note reviewed.  Constitutional:      General: She is not in acute distress.    Appearance: She is well-developed. She is obese.  HENT:     Head: Atraumatic.     Mouth/Throat:     Mouth: Mucous membranes are moist.     Comments: No concerning oral lesion Eyes:     Conjunctiva/sclera: Conjunctivae normal.  Pulmonary:     Effort: Pulmonary effort is normal.  Musculoskeletal:     Cervical back: Normal range  of motion and neck supple. No rigidity or tenderness.  Skin:    Findings: Rash (Scattered pustular lesions noted through palms of hands and soles of feet as well as on the trunk and legs) present.  Neurological:     Mental Status: She is alert and oriented to person, place, and time.  Psychiatric:        Mood and Affect: Mood normal.         ED Results / Procedures / Treatments   Labs (all labs ordered are listed, but only abnormal results are displayed) Labs Reviewed  URINALYSIS, ROUTINE W REFLEX MICROSCOPIC - Abnormal; Notable for the following components:      Result Value   APPearance HAZY (*)    All other components within normal limits  PREGNANCY, URINE  RPR  HIV ANTIBODY (ROUTINE TESTING W REFLEX)  GC/CHLAMYDIA PROBE  AMP ([place]) NOT AT [facility]    EKG None  Radiology No results found.  Procedures Procedures    Medications Ordered in ED Medications  penicillin g benzathine (BICILLIN LA) 1200000 UNIT/2ML injection 2.4 Million Units (2.4 Million Units Intramuscular Given 11/14/23 1156)    ED Course/ Medical Decision Making/ A&P                                 Medical Decision Making Amount and/or Complexity of Data Reviewed Labs: ordered.  Risk Prescription drug management.   BP 119/84 (BP Location: Left Arm)   Pulse 90   Temp 97.8 F (36.6 C) (Oral)   Resp 17   Ht 5\' 2"  (1.575 m)   Wt (!) 142 kg   SpO2 100%   BMI 57.26 kg/m   36:98 AM   38 year old female history of bipolar, schizophrenia presenting with complaints of rash.  Patient states she noticed a painful rash that appears on hands and feet for the past 2 weeks.  Initially it was slightly itchy and now it is more painful.  There are no associated joint pain no fever sore throat or cold symptoms.  She did develop a throat infection several weeks ago and was started on antibiotic as well as prednisone.  She is unsure of it was related to her rash as it appears after that.  She denies any new sexual partner denies any prior STI no complaints of vaginal bleeding or vaginal discharge.  She did change her soap several weeks prior.  On exam this is an well-appearing obese female resting comfortably in bed appears to be in no acute discomfort.  She does have multiple scattered palpable raised lesions noted to palms of hands and soles of feet as well as to her thighs and trunk.  Please refer to picture above for better visualization.  It is difficult to identify the specific rash however due to the presentation on the palms of hands and soles of feet we will prophylactically treat her for potential syphilis with 2,400,000 unit of Bicillin IM.  STI screening test sent.  This could be a drug allergy however it is less likely.  No  anaphylactic reaction.  No significant headache or neck stiffness to suggest meningococcal meningitis.  -Labs ordered, independently viewed and interpreted by me.  Labs remarkable for preg test normal.  UA without signs of infection.   -This patient presents to the ED for concern of rash, this involves an extensive number of treatment options, and is a complaint that carries with it a high  risk of complications and morbidity.  The differential diagnosis includes syphilis rash, drug reaction, allergic, infectious rash -Co morbidities that complicate the patient evaluation includes schizophrenia -Treatment includes bicillin -Reevaluation of the patient after these medicines showed that the patient stayed the same -PCP office notes or outside notes reviewed -Discussion with attending Dr. Dolan Freiberg -Escalation to admission/observation considered: patients feels much better, is comfortable with discharge, and will follow up with PCP -Prescription medication considered, patient comfortable with OTC meds -Social Determinant of Health considered which includes tobacco use  Patient has discontinued her recent antibiotic and steroid.  I gave patient return precaution if she develop severe headache fever or neck stiffness.  Otherwise she can follow-up on the test result through MyChart.         Final Clinical Impression(s) / ED Diagnoses Final diagnoses:  Rash and nonspecific skin eruption    Rx / DC Orders ED Discharge Orders     None         Debbra Fairy, PA-C 11/14/23 1310    Long, Shereen Dike, MD 11/18/23 [phone]

## 2023-11-14 NOTE — ED Triage Notes (Signed)
 Pt comes in for rash on hands and feet for several weeks.  Painful when walking

## 2023-11-14 NOTE — Discharge Instructions (Signed)
 You have been evaluated for your rash.  You have received antibiotic to cover for potential infection.  A blood test has been sent and if you test positive for infection, you will be notified.  You can check the result through MyChart, link below.  Return if you develop fever, severe headache and neck stiffness

## 2023-11-15 LAB — RPR: RPR Ser Ql: NONREACTIVE

## 2023-11-15 LAB — GC/CHLAMYDIA PROBE AMP (~~LOC~~) NOT AT ARMC
Chlamydia: NEGATIVE
Comment: NEGATIVE
Comment: NORMAL
Neisseria Gonorrhea: NEGATIVE

## 2023-11-24 ENCOUNTER — Institutional Professional Consult (permissible substitution) (INDEPENDENT_AMBULATORY_CARE_PROVIDER_SITE_OTHER)

## 2024-01-18 ENCOUNTER — Ambulatory Visit: Admitting: Podiatry

## 2024-01-19 ENCOUNTER — Ambulatory Visit: Admitting: Podiatry

## 2024-01-26 ENCOUNTER — Ambulatory Visit (INDEPENDENT_AMBULATORY_CARE_PROVIDER_SITE_OTHER)

## 2024-01-26 ENCOUNTER — Ambulatory Visit (INDEPENDENT_AMBULATORY_CARE_PROVIDER_SITE_OTHER): Admitting: Podiatry

## 2024-01-26 DIAGNOSIS — D2372 Other benign neoplasm of skin of left lower limb, including hip: Secondary | ICD-10-CM | POA: Diagnosis not present

## 2024-01-26 DIAGNOSIS — M795 Residual foreign body in soft tissue: Secondary | ICD-10-CM | POA: Diagnosis not present

## 2024-01-26 DIAGNOSIS — M7751 Other enthesopathy of right foot: Secondary | ICD-10-CM

## 2024-01-26 DIAGNOSIS — M7752 Other enthesopathy of left foot: Secondary | ICD-10-CM

## 2024-01-26 DIAGNOSIS — B351 Tinea unguium: Secondary | ICD-10-CM | POA: Diagnosis not present

## 2024-01-26 DIAGNOSIS — L600 Ingrowing nail: Secondary | ICD-10-CM | POA: Diagnosis not present

## 2024-01-26 DIAGNOSIS — M778 Other enthesopathies, not elsewhere classified: Secondary | ICD-10-CM | POA: Diagnosis not present

## 2024-01-26 MED ORDER — MELOXICAM 7.5 MG PO TABS
7.5000 mg | ORAL_TABLET | Freq: Every day | ORAL | 0 refills | Status: DC | PRN
Start: 2024-01-26 — End: 2024-02-26

## 2024-01-26 NOTE — Patient Instructions (Addendum)
 For inserts I like POWERSTEPS, SUPERFEET. For shoes I like [employer], [place], [employer] Balance   Take dressing off in 8 hours and wash the foot with soap and water . If it is hurting or becomes uncomfortable before the 8 hours, go ahead and remove the bandage and wash the area.  If it blisters, apply antibiotic ointment and a band-aid.  Monitor for any signs/symptoms of infection. Call the office immediately if any occur or go directly to the emergency room. Call with any questions/concerns.  --  Plantar Fasciitis (Heel Spur Syndrome) with Rehab The plantar fascia is a fibrous, ligament-like, soft-tissue structure that spans the bottom of the foot. Plantar fasciitis is a condition that causes pain in the foot due to inflammation of the tissue. SYMPTOMS  Pain and tenderness on the underneath side of the foot. Pain that worsens with standing or walking. CAUSES  Plantar fasciitis is caused by irritation and injury to the plantar fascia on the underneath side of the foot. Common mechanisms of injury include: Direct trauma to bottom of the foot. Damage to a small nerve that runs under the foot where the main fascia attaches to the heel bone. Stress placed on the plantar fascia due to bone spurs. RISK INCREASES WITH:  Activities that place stress on the plantar fascia (running, jumping, pivoting, or cutting). Poor strength and flexibility. Improperly fitted shoes. Tight calf muscles. Flat feet. Failure to warm-up properly before activity. Obesity. PREVENTION Warm up and stretch properly before activity. Allow for adequate recovery between workouts. Maintain physical fitness: Strength, flexibility, and endurance. Cardiovascular fitness. Maintain a health body weight. Avoid stress on the plantar fascia. Wear properly fitted shoes, including arch supports for individuals who have flat feet.  PROGNOSIS  If treated properly, then the symptoms of plantar fasciitis usually resolve without surgery.  However, occasionally surgery is necessary.  RELATED COMPLICATIONS  Recurrent symptoms that may result in a chronic condition. Problems of the lower back that are caused by compensating for the injury, such as limping. Pain or weakness of the foot during push-off following surgery. Chronic inflammation, scarring, and partial or complete fascia tear, occurring more often from repeated injections.  TREATMENT  Treatment initially involves the use of ice and medication to help reduce pain and inflammation. The use of strengthening and stretching exercises may help reduce pain with activity, especially stretches of the Achilles tendon. These exercises may be performed at home or with a therapist. Your caregiver may recommend that you use heel cups of arch supports to help reduce stress on the plantar fascia. Occasionally, corticosteroid injections are given to reduce inflammation. If symptoms persist for greater than 6 months despite non-surgical (conservative), then surgery may be recommended.   MEDICATION  If pain medication is necessary, then nonsteroidal anti-inflammatory medications, such as aspirin and ibuprofen , or other minor pain relievers, such as acetaminophen , are often recommended. Do not take pain medication within 7 days before surgery. Prescription pain relievers may be given if deemed necessary by your caregiver. Use only as directed and only as much as you need. Corticosteroid injections may be given by your caregiver. These injections should be reserved for the most serious cases, because they may only be given a certain number of times.  HEAT AND COLD Cold treatment (icing) relieves pain and reduces inflammation. Cold treatment should be applied for 10 to 15 minutes every 2 to 3 hours for inflammation and pain and immediately after any activity that aggravates your symptoms. Use ice packs or massage the area with  a piece of ice (ice massage). Heat treatment may be used prior to  performing the stretching and strengthening activities prescribed by your caregiver, physical therapist, or athletic trainer. Use a heat pack or soak the injury in warm water .  SEEK IMMEDIATE MEDICAL CARE IF: Treatment seems to offer no benefit, or the condition worsens. Any medications produce adverse side effects.  EXERCISES- RANGE OF MOTION (ROM) AND STRETCHING EXERCISES - Plantar Fasciitis (Heel Spur Syndrome) These exercises may help you when beginning to rehabilitate your injury. Your symptoms may resolve with or without further involvement from your physician, physical therapist or athletic trainer. While completing these exercises, remember:  Restoring tissue flexibility helps normal motion to return to the joints. This allows healthier, less painful movement and activity. An effective stretch should be held for at least 30 seconds. A stretch should never be painful. You should only feel a gentle lengthening or release in the stretched tissue.  RANGE OF MOTION - Toe Extension, Flexion Sit with your right / left leg crossed over your opposite knee. Grasp your toes and gently pull them back toward the top of your foot. You should feel a stretch on the bottom of your toes and/or foot. Hold this stretch for 10 seconds. Now, gently pull your toes toward the bottom of your foot. You should feel a stretch on the top of your toes and or foot. Hold this stretch for 10 seconds. Repeat  times. Complete this stretch 3 times per day.   RANGE OF MOTION - Ankle Dorsiflexion, Active Assisted Remove shoes and sit on a chair that is preferably not on a carpeted surface. Place right / left foot under knee. Extend your opposite leg for support. Keeping your heel down, slide your right / left foot back toward the chair until you feel a stretch at your ankle or calf. If you do not feel a stretch, slide your bottom forward to the edge of the chair, while still keeping your heel down. Hold this stretch for  10 seconds. Repeat 3 times. Complete this stretch 2 times per day.   STRETCH  Gastroc, Standing Place hands on wall. Extend right / left leg, keeping the front knee somewhat bent. Slightly point your toes inward on your back foot. Keeping your right / left heel on the floor and your knee straight, shift your weight toward the wall, not allowing your back to arch. You should feel a gentle stretch in the right / left calf. Hold this position for 10 seconds. Repeat 3 times. Complete this stretch 2 times per day.  STRETCH  Soleus, Standing Place hands on wall. Extend right / left leg, keeping the other knee somewhat bent. Slightly point your toes inward on your back foot. Keep your right / left heel on the floor, bend your back knee, and slightly shift your weight over the back leg so that you feel a gentle stretch deep in your back calf. Hold this position for 10 seconds. Repeat 3 times. Complete this stretch 2 times per day.  STRETCH  Gastrocsoleus, Standing  Note: This exercise can place a lot of stress on your foot and ankle. Please complete this exercise only if specifically instructed by your caregiver.  Place the ball of your right / left foot on a step, keeping your other foot firmly on the same step. Hold on to the wall or a rail for balance. Slowly lift your other foot, allowing your body weight to press your heel down over the edge of the  step. You should feel a stretch in your right / left calf. Hold this position for 10 seconds. Repeat this exercise with a slight bend in your right / left knee. Repeat 3 times. Complete this stretch 2 times per day.   STRENGTHENING EXERCISES - Plantar Fasciitis (Heel Spur Syndrome)  These exercises may help you when beginning to rehabilitate your injury. They may resolve your symptoms with or without further involvement from your physician, physical therapist or athletic trainer. While completing these exercises, remember:  Muscles can gain  both the endurance and the strength needed for everyday activities through controlled exercises. Complete these exercises as instructed by your physician, physical therapist or athletic trainer. Progress the resistance and repetitions only as guided.  STRENGTH - Towel Curls Sit in a chair positioned on a non-carpeted surface. Place your foot on a towel, keeping your heel on the floor. Pull the towel toward your heel by only curling your toes. Keep your heel on the floor. Repeat 3 times. Complete this exercise 2 times per day.  STRENGTH - Ankle Inversion Secure one end of a rubber exercise band/tubing to a fixed object (table, pole). Loop the other end around your foot just before your toes. Place your fists between your knees. This will focus your strengthening at your ankle. Slowly, pull your big toe up and in, making sure the band/tubing is positioned to resist the entire motion. Hold this position for 10 seconds. Have your muscles resist the band/tubing as it slowly pulls your foot back to the starting position. Repeat 3 times. Complete this exercises 2 times per day.  Document Released: 07/25/2005 Document Revised: 10/17/2011 Document Reviewed: 11/06/2008 Exit[employer] Patient Information 2014 Exit[employer], [place].  --  Ingrown Toenail  An ingrown toenail occurs when the corner or sides of a toenail grow into the surrounding skin. This causes discomfort and pain. The big toe is most commonly affected, but any of the toes can be affected. If an ingrown toenail is not treated, it can become infected. What are the causes? This condition may be caused by: Wearing shoes that are too small or tight. An injury, such as stubbing your toe or having your toe stepped on. Improper cutting or care of your toenails. Having nail or foot abnormalities that were present from birth (congenital abnormalities), such as having a nail that is too big for your toe. What increases the risk? The following factors  may make you more likely to develop ingrown toenails: Age. Nails tend to get thicker with age, so ingrown nails are more common among older people. Cutting your toenails incorrectly, such as cutting them very short or cutting them unevenly. An ingrown toenail is more likely to get infected if you have: Diabetes. Blood flow (circulation) problems. What are the signs or symptoms? Symptoms of an ingrown toenail may include: Pain, soreness, or tenderness. Redness. Swelling. Hardening of the skin that surrounds the toenail. Signs that an ingrown toenail may be infected include: Fluid or pus. Symptoms that get worse. How is this diagnosed? Ingrown toenails may be diagnosed based on: Your symptoms and medical history. A physical exam. Labs or tests. If you have fluid or blood coming from your toenail, a sample may be collected to test for the specific type of bacteria that is causing the infection. How is this treated? Treatment depends on the severity of your symptoms. You may be able to care for your toenail at home. If you have an infection, you may be prescribed antibiotic medicines. If you  have fluid or pus draining from your toenail, your health care provider may drain it. If you have trouble walking, you may be given crutches to use. If you have a severe or infected ingrown toenail, you may need a procedure to remove part or all of the nail. Follow these instructions at home: Foot care  Check your wound every day for signs of infection, or as often as told by your health care provider. Check for: More redness, swelling, or pain. More fluid or blood. Warmth. Pus or a bad smell. Do not pick at your toenail or try to remove it yourself. Soak your foot in warm, soapy water . Do this for 20 minutes, 3 times a day, or as often as told by your health care provider. This helps to keep your toe clean and your skin soft. Wear shoes that fit well and are not too tight. Your health care  provider may recommend that you wear open-toed shoes while you heal. Trim your toenails regularly and carefully. Cut your toenails straight across to prevent injury to the skin at the corners of the toenail. Do not cut your nails in a curved shape. Keep your feet clean and dry to help prevent infection. General instructions Take over-the-counter and prescription medicines only as told by your health care provider. If you were prescribed an antibiotic, take it as told by your health care provider. Do not stop taking the antibiotic even if you start to feel better. If your health care provider told you to use crutches to help you move around, use them as instructed. Return to your normal activities as told by your health care provider. Ask your health care provider what activities are safe for you. Keep all follow-up visits. This is important. Contact a health care provider if: You have more redness, swelling, pain, or other symptoms that do not improve with treatment. You have fluid, blood, or pus coming from your toenail. You have a red streak on your skin that starts at your foot and spreads up your leg. You have a fever. Summary An ingrown toenail occurs when the corner or sides of a toenail grow into the surrounding skin. This causes discomfort and pain. The big toe is most commonly affected, but any of the toes can be affected. If an ingrown toenail is not treated, it can become infected. Fluid or pus draining from your toenail is a sign of infection. Your health care provider may need to drain it. You may be given antibiotics to treat the infection. Trimming your toenails regularly and properly can help you prevent an ingrown toenail. This information is not intended to replace advice given to you by your health care provider. Make sure you discuss any questions you have with your health care provider. Document Revised: 11/24/2020 Document Reviewed: 11/24/2020 Elsevier Patient Education   2024 [employer].

## 2024-01-27 NOTE — Progress Notes (Signed)
 Subjective:  Patient ID: Savannah [name], female    DOB: September 18, 1985,  MRN: [id number]  Chief Complaint  Patient presents with   Foot Pain    RM#13 Right foot pain patient states has questions about nails on left foot.    Discussed the use of AI scribe software for clinical note transcription with the patient, who gave verbal consent to proceed.  History of Present Illness Savannah [name] is a 38 year old female who presents with bilateral foot pain and toenail issues.  She experiences bilateral foot pain, with the right foot being more problematic. The pain is located at the top of the right foot, persisting for about a month, and worsens with work. It is most severe in the mornings, causing difficulty in applying pressure and resulting in a limp. Lidocaine  patches have not provided relief. No swelling or known injury is present.  On the left foot, a painful mark has been present for two to three weeks, with no known injury. She is unsure if she stepped on something.  She has issues with the toenails on both big toes, which are painful and difficult to manage. Despite self-care and professional pedicures, the nails remain painful, ingrown, and thicker than usual. She often goes barefoot, which she finds hard to stop.      Objective:    Physical Exam General: AAO x3, NAD  Dermatological: Nails are hypertrophic, dystrophic.  There is incurvation present of the hallux toenails bilaterally.  There is no erythema, drainage or pus or any signs of infection.  There are no open lesions.  On the plantar midfoot on the left side there is a punctate annular hyperkeratotic lesion.  There is no underlying ulceration, drainage or any signs of infection.  No foreign body identified.  Vascular: Dorsalis Pedis artery and Posterior Tibial artery pedal pulses are 2/4 bilateral with immedate capillary fill time. There is no pain with calf compression, swelling, warmth, erythema.   Neruologic: Grossly  intact via light touch bilateral.  Negative Tinel's sign.  Musculoskeletal: Decreased medial arch height upon weightbearing bilaterally.  She has tenderness palpation subjectively the dorsal aspect of the right midfoot.  Not able to appreciate any area pinpoint tenderness today.  No edema, ecchymosis.  There is no erythema.  Flexor, extensor tendons appear to be intact.  MMT 5/5.  Gait: Unassisted, Nonantalgic.     No images are attached to the encounter.    Results  RADIOLOGY Right foot X-ray: Multiple views obtained.  No fractures, mild pronation, joint space maintained.  (01/26/2024) Left foot X-ray: Multiple views obtained.  No foreign bodies, calcifications or evidence of foreign body.  (01/26/2024)   Assessment:   1. Capsulitis of foot, right   2. Residual foreign body in soft tissue   3. Ingrown toenail   4. Dermatophytosis of nail      Plan:  Patient was evaluated and treated and all questions answered.  Assessment and Plan Assessment & Plan Right foot pain due to flatfoot/pronation Chronic right foot pain likely due to flatfoot and pronation, with joint inflammation. X-rays show no fractures but indicate pronation. - Prescribe meloxicam  for pain management. - Advise icing the foot regularly. - Provide a list of shoe inserts for better arch support. - Recommend changing to supportive shoe brands such as [employer] and [employer]. - Provide a worksheet with exercises to stretch and strengthen foot tendons and muscles. - If symptoms persist consider advanced imaging.  Ingrown toenails with onychomycosis Bilateral ingrown toenails with  thickening due to onychomycosis. Discussed procedural option to remove ingrown toenail corners and apply phenol. - Order a compound antifungal medication with urea from [facility] to thin the nails and treat fungus. - Schedule a procedure to remove the ingrown toenail corners and apply phenol.  Left foot wart Painful lesion on  the left foot, possibly a wart or callus. X-ray ordered to rule out foreign body. - Sharply debrided lesion without any complications or bleeding.  Cantharone is applied followed by an occlusive bandage.  Postprocedure instructions discussed.  Monitor for any signs or symptoms of infection.   No follow-ups on file.

## 2024-02-19 ENCOUNTER — Ambulatory Visit (INDEPENDENT_AMBULATORY_CARE_PROVIDER_SITE_OTHER): Admitting: Podiatry

## 2024-02-19 VITALS — Ht 62.0 in | Wt 313.0 lb

## 2024-02-19 DIAGNOSIS — L600 Ingrowing nail: Secondary | ICD-10-CM | POA: Diagnosis not present

## 2024-02-19 MED ORDER — CEPHALEXIN 500 MG PO CAPS: 500.0000 mg | ORAL_CAPSULE | Freq: Three times a day (TID) | ORAL | 0 refills | Status: AC

## 2024-02-19 NOTE — Progress Notes (Unsigned)
 Subjective: Chief Complaint  Patient presents with   Nail Problem    RM 11 Patient is here for possible ingrown toe nail of the right and left hallux. Patient states pain in both toes.   38 year old female presents the office today for follow evaluation of bilateral ingrown toenails. She states that both of the big toes do cause pain, mostly with pressure.  She has not seen any drainage or pus.  Objective: AAO x3, NAD DP/PT pulses palpable bilaterally, CRT less than 3 seconds Nails can to be hypertrophic, dystrophic.  There is incurvation present on both medial and lateral aspects of bilateral toenails.  There is no edema, erythema or any signs of infection. No pain with calf compression, swelling, warmth, erythema  Assessment: Ingrown toenails bilateral hallux  Plan: -All treatment options discussed with the patient including all alternatives, risks, complications.  -We have a long discussion today in regards to treatment options for her toenails.  I would reorder the compound cream through [facility] to help with the other toenails but would hold off on the big toes as she wants to proceed with the procedure to remove the ingrown toenails today.  We discussed the procedure as well as postoperative course we discussed doing this with or without chemical matrixectomy.  Initially we decided to do this without the chemical and during the procedure she decided she would have both sides of both big toes done with chemical matricectomy and verbal consent was obtained.  We discussed the risks. -At this time, the patient is requesting partial nail removal with chemical matricectomy to the symptomatic portion of the nail. Risks and complications were discussed with the patient for which they understand and written consent was obtained. Under sterile conditions a total of 3 mL of a mixture of 2% lidocaine  plain and 0.5% Marcaine plain was infiltrated in a hallux block fashion. Once anesthetized,  the skin was prepped in sterile fashion. A tourniquet was then applied. Next the medial, lateral aspect of hallux nail border was then sharply excised making sure to remove the entire offending nail border. Once the nails were ensured to be removed area was debrided and the underlying skin was intact. There is no purulence identified in the procedure. Next phenol was then applied under standard conditions and copiously irrigated.  Silvadene was applied. A dry sterile dressing was applied. After application of the dressing the tourniquet was removed and there is found to be an immediate capillary refill time to the digit. The patient tolerated the procedure well any complications. Post procedure instructions were discussed the patient for which he verbally understood. Discussed signs/symptoms of infection and directed to call the office immediately should any occur or go directly to the emergency room. In the meantime, encouraged to call the office with any questions, concerns, changes symptoms.  Return in about 2 weeks (around 03/04/2024), or if symptoms worsen or fail to improve, for nail check.  Donnice JONELLE Fees DPM  Resent compound cream to [facility] 02/22/2024

## 2024-02-19 NOTE — Patient Instructions (Signed)

## 2024-02-25 ENCOUNTER — Other Ambulatory Visit: Payer: Self-pay | Admitting: Podiatry

## 2024-03-11 ENCOUNTER — Ambulatory Visit: Admitting: Podiatry

## 2024-03-12 ENCOUNTER — Encounter: Payer: Self-pay | Admitting: Podiatry

## 2024-03-12 NOTE — Telephone Encounter (Signed)
 If we could get her in sooner that would be fantastic.

## 2024-03-26 ENCOUNTER — Ambulatory Visit: Admitting: Podiatry

## 2024-04-09 ENCOUNTER — Ambulatory Visit: Admitting: Podiatry
